# Patient Record
Sex: Male | Born: 1945
Health system: Southern US, Community
[De-identification: ages and names within clinical notes are randomized; demographics above are authoritative.]

## PROBLEM LIST (undated history)

## (undated) DIAGNOSIS — K5790 Diverticulosis of intestine, part unspecified, without perforation or abscess without bleeding: Secondary | ICD-10-CM

## (undated) DIAGNOSIS — I10 Essential (primary) hypertension: Secondary | ICD-10-CM

## (undated) DIAGNOSIS — E785 Hyperlipidemia, unspecified: Secondary | ICD-10-CM

## (undated) DIAGNOSIS — M1990 Unspecified osteoarthritis, unspecified site: Secondary | ICD-10-CM

## (undated) HISTORY — PX: KNEE ARTHROSCOPY: SHX127

## (undated) HISTORY — PX: KNEE ARTHROSCOPY: SUR90

---

## 2004-11-28 ENCOUNTER — Ambulatory Visit: Payer: Self-pay | Admitting: Gastroenterology

## 2004-12-10 ENCOUNTER — Ambulatory Visit: Payer: Self-pay | Admitting: Gastroenterology

## 2005-07-29 ENCOUNTER — Ambulatory Visit (HOSPITAL_COMMUNITY): Admission: RE | Admit: 2005-07-29 | Discharge: 2005-07-29 | Payer: Self-pay | Admitting: Orthopedic Surgery

## 2008-05-23 ENCOUNTER — Ambulatory Visit: Payer: Self-pay | Admitting: Gastroenterology

## 2008-05-30 ENCOUNTER — Ambulatory Visit: Payer: Self-pay | Admitting: Gastroenterology

## 2008-05-30 LAB — CONVERTED CEMR LAB
ALT: 52 units/L (ref 0–53)
AST: 32 units/L (ref 0–37)
Albumin: 4.4 g/dL (ref 3.5–5.2)
Basophils Absolute: 0.1 10*3/uL (ref 0.0–0.1)
Basophils Relative: 1.6 % (ref 0.0–3.0)
CO2: 30 meq/L (ref 19–32)
Calcium: 9 mg/dL (ref 8.4–10.5)
Chloride: 105 meq/L (ref 96–112)
Eosinophils Absolute: 0 10*3/uL (ref 0.0–0.7)
Ferritin: 411.5 ng/mL — ABNORMAL HIGH (ref 22.0–322.0)
Folate: 5.6 ng/mL
GFR calc Af Amer: 98 mL/min
HDL: 37.4 mg/dL — ABNORMAL LOW (ref >39.0)
Lymphocytes Relative: 24.3 % (ref 12.0–46.0)
MCHC: 35.1 g/dL (ref 30.0–36.0)
MCV: 101.5 fL — ABNORMAL HIGH (ref 78.0–100.0)
Neutrophils Relative %: 68.8 % (ref 43.0–77.0)
PSA: 0.28 ng/mL (ref 0.10–4.00)
Platelets: 146 10*3/uL — ABNORMAL LOW (ref 150–400)
Potassium: 4.3 meq/L (ref 3.5–5.1)
RBC: 4.28 M/uL (ref 4.22–5.81)
Total Protein: 6.7 g/dL (ref 6.0–8.3)
Triglycerides: 85 mg/dL (ref 0–149)
VLDL: 17 mg/dL (ref 0–40)
Vitamin B-12: 480 pg/mL (ref 211–911)
WBC: 5.8 10*3/uL (ref 4.5–10.5)

## 2009-07-25 ENCOUNTER — Ambulatory Visit (HOSPITAL_BASED_OUTPATIENT_CLINIC_OR_DEPARTMENT_OTHER): Admission: RE | Admit: 2009-07-25 | Discharge: 2009-07-25 | Payer: Self-pay | Admitting: Orthopedic Surgery

## 2010-11-13 LAB — FUNGUS CULTURE W SMEAR

## 2010-11-13 LAB — ANAEROBIC CULTURE

## 2010-11-13 LAB — WOUND CULTURE: Gram Stain: NONE SEEN

## 2010-12-28 NOTE — Op Note (Signed)
NAME:  Christopher Gibbs, Christopher Gibbs NO.:  [id number]   MEDICAL RECORD NO.:  [id number]          PATIENT TYPE:  AMB   LOCATION:  DAY                          FACILITY:  [facility]   PHYSICIAN:  Ollen Gross, M.D.    DATE OF BIRTH:  05-09-1946   DATE OF PROCEDURE:  07/29/2005  DATE OF DISCHARGE:                                 OPERATIVE REPORT   PREOPERATIVE DIAGNOSIS:  Left knee medial meniscal tear and chondral defect.   PREOPERATIVE DIAGNOSIS:  Left knee medial meniscal tear and chondral defect.   PROCEDURE:  Left knee arthroscopy with meniscal debridement medially and  chondroplasty.   SURGEON:  Dr. Lequita Halt   ASSISTANT:  None.   ANESTHESIA:  General.   ESTIMATED BLOOD LOSS:  Minimal.   COMPLICATIONS:  None.   CONDITION:  Stable to recovery.   BRIEF CLINICAL NOTE:  Christopher Gibbs is a 65 year old male with a several month  history of progressively worsening left knee pain and mechanical symptoms.  Exam and history suggested meniscal tear confirmed by MRI.  He presents now  for arthroscopy and debridement.   PROCEDURE IN DETAIL:  After the successful administration of general  anesthetic, a tourniquet is placed on the left thigh and left lower  extremity prepped and draped in the usual sterile fashion.  The arthroscopic  portals are then infiltrated with a total of 20 mL of 1% Xylocaine.  Standard superomedial and inferolateral incisions are made, and the inflow  cannula is passed superomedial and camera passed inferolateral.  Arthroscopic visualization proceeds.   The undersurface of the patella showed some grade 1 and 2 chondromalacia but  no focal defects and no unstable-appearing cartilage.  The trochlea had a  similar appearance with grade 2 and some areas of grade 3 but no exposed  bone and no unstable cartilage.  Medial and lateral gutters are visualized.  There is significant synovitis but no loose bodies in the gutters.  Flexion  valgus force is applied to the  knee, and the medial compartment is entered.  There is immediate evidence of a tear in the body and posterior horn of the  medial meniscus as well as a large chondral delamination on the medial  femoral condyle.  The spinal needle is used to localize the inferomedial  portal, small incision made, dilator placed, and the meniscus is found to be  unstable.  The meniscus is debrided back to a stable base with baskets and a  4.2 mm shaver.  It is probed and found to be stable.  Medial femoral condyle  has a 2 x 2 cm area where the cartilage is delaminating.  This is debrided  back to a stable bony base with stable cartilaginous edges.  The surrounding  centimeters, though had some chondromalacia but no full-thickness defects.  Once the debridement is performed, the surface is abraded.  The tibial  plateau looked normal.  Intercondylar notch is visualized; ACL looks normal.  The lateral compartment also looks normal.  The ArthroCare device is then  used to seal the edges of the medial meniscus and also to debride  the beefy-  red hypertrophic synovium.  Once this is completed, then the  arthroscopic equipment is removed from the inferior portals which are closed  with interrupted 4-0 nylon.  Marcaine 20 mL 0.25% with epinephrine are  injected through the inflow cannula.  Then that is removed and that portal  closed with nylon.  A bulky sterile dressing is applied, and he is awakened  and transported to recovery in stable condition.      Ollen Gross, M.D.  Electronically Signed     FA/MEDQ  D:  07/29/2005  T:  07/30/2005  Job:  [id number]

## 2012-12-15 ENCOUNTER — Other Ambulatory Visit: Payer: Self-pay | Admitting: Dermatology

## 2013-03-12 ENCOUNTER — Encounter: Payer: Self-pay | Admitting: Gastroenterology

## 2013-05-18 ENCOUNTER — Other Ambulatory Visit: Payer: Self-pay | Admitting: Dermatology

## 2013-06-14 ENCOUNTER — Encounter: Payer: Self-pay | Admitting: Gastroenterology

## 2013-06-24 ENCOUNTER — Encounter: Payer: Self-pay | Admitting: Gastroenterology

## 2013-06-24 ENCOUNTER — Ambulatory Visit (AMBULATORY_SURGERY_CENTER): Payer: Self-pay

## 2013-06-24 VITALS — Ht 71.0 in | Wt 235.0 lb

## 2013-06-24 DIAGNOSIS — Z1211 Encounter for screening for malignant neoplasm of colon: Secondary | ICD-10-CM

## 2013-06-24 MED ORDER — MOVIPREP 100 G PO SOLR: 1.0000 | Freq: Once | ORAL | Status: DC

## 2013-07-05 ENCOUNTER — Encounter: Payer: Self-pay | Admitting: Gastroenterology

## 2013-07-05 ENCOUNTER — Other Ambulatory Visit: Payer: Self-pay | Admitting: *Deleted

## 2013-07-05 ENCOUNTER — Other Ambulatory Visit (INDEPENDENT_AMBULATORY_CARE_PROVIDER_SITE_OTHER): Payer: BC Managed Care – PPO

## 2013-07-05 ENCOUNTER — Ambulatory Visit (AMBULATORY_SURGERY_CENTER): Payer: BC Managed Care – PPO | Admitting: Gastroenterology

## 2013-07-05 VITALS — BP 116/79 | HR 57 | Temp 98.7°F | Resp 19 | Ht 71.0 in | Wt 235.0 lb

## 2013-07-05 DIAGNOSIS — R6889 Other general symptoms and signs: Secondary | ICD-10-CM

## 2013-07-05 DIAGNOSIS — Z1211 Encounter for screening for malignant neoplasm of colon: Secondary | ICD-10-CM

## 2013-07-05 DIAGNOSIS — R899 Unspecified abnormal finding in specimens from other organs, systems and tissues: Secondary | ICD-10-CM

## 2013-07-05 DIAGNOSIS — K5730 Diverticulosis of large intestine without perforation or abscess without bleeding: Secondary | ICD-10-CM

## 2013-07-05 LAB — CBC WITH DIFFERENTIAL/PLATELET
Basophils Absolute: 0 10*3/uL (ref 0.0–0.1)
Basophils Relative: 0.5 % (ref 0.0–3.0)
Eosinophils Absolute: 0 10*3/uL (ref 0.0–0.7)
Hemoglobin: 13.9 g/dL (ref 13.0–17.0)
MCHC: 34.9 g/dL (ref 30.0–36.0)
MCV: 98.6 fl (ref 78.0–100.0)
Monocytes Absolute: 0.3 10*3/uL (ref 0.1–1.0)
Monocytes Relative: 6 % (ref 3.0–12.0)
Neutro Abs: 3.6 10*3/uL (ref 1.4–7.7)
Platelets: 149 10*3/uL — ABNORMAL LOW (ref 150.0–400.0)
RBC: 4.03 Mil/uL — ABNORMAL LOW (ref 4.22–5.81)

## 2013-07-05 LAB — FERRITIN: Ferritin: 241.7 ng/mL (ref 22.0–322.0)

## 2013-07-05 LAB — LIPID PANEL
Cholesterol: 184 mg/dL (ref 0–200)
Total CHOL/HDL Ratio: 6

## 2013-07-05 LAB — COMPREHENSIVE METABOLIC PANEL
AST: 37 U/L (ref 0–37)
Alkaline Phosphatase: 53 U/L (ref 39–117)
BUN: 14 mg/dL (ref 6–23)
Calcium: 8.6 mg/dL (ref 8.4–10.5)
Creatinine, Ser: 1 mg/dL (ref 0.4–1.5)
Sodium: 138 mEq/L (ref 135–145)

## 2013-07-05 LAB — IBC PANEL
Iron: 82 ug/dL (ref 42–165)
Transferrin: 236.1 mg/dL (ref 212.0–360.0)

## 2013-07-05 LAB — VITAMIN B12: Vitamin B-12: 515 pg/mL (ref 211–911)

## 2013-07-05 LAB — FOLATE: Folate: 7 ng/mL (ref >5.9)

## 2013-07-05 MED ORDER — PRAMOXINE-HC 1-1 % EX CREA: TOPICAL_CREAM | Freq: Three times a day (TID) | CUTANEOUS | Status: DC

## 2013-07-05 MED ORDER — SODIUM CHLORIDE 0.9 % IV SOLN: 500.0000 mL | INTRAVENOUS | Status: DC

## 2013-07-05 NOTE — Patient Instructions (Signed)

## 2013-07-05 NOTE — Progress Notes (Signed)
Patient did not experience any of the following events: a burn prior to discharge; a fall within the facility; wrong site/side/patient/procedure/implant event; or a hospital transfer or hospital admission upon discharge from the facility. (G8907) Patient did not have preoperative order for IV antibiotic SSI prophylaxis. (G8918)  

## 2013-07-05 NOTE — Op Note (Signed)
[facility] [address] [place], [address]   COLONOSCOPY PROCEDURE REPORT  PATIENT: Christopher, Gibbs  MR#: [id number] BIRTHDATE: 1945-10-31 , 66  yrs. old GENDER: Male ENDOSCOPIST: Mardella Layman, MD, [facility] REFERRED BY: PROCEDURE DATE:  07/05/2013 PROCEDURE:   Colonoscopy, screening First Screening Colonoscopy - Avg.  risk and is 50 yrs.  old or older - No.      History of Adenoma - Now for follow-up colonoscopy & has been > or = to 3 yrs.  N/A ASA CLASS:   Class I INDICATIONS:average risk screening. MEDICATIONS: propofol (Diprivan) 350mg  IV  DESCRIPTION OF PROCEDURE:   After the risks benefits and alternatives of the procedure were thoroughly explained, informed consent was obtained.  A digital rectal exam revealed no abnormalities of the rectum.   The LB [id number]  endoscope was introduced through the anus and advanced to the cecum, which was identified by both the appendix and ileocecal valve. No adverse events experienced.   The quality of the prep was good, using MoviPrep  The instrument was then slowly withdrawn as the colon was fully examined.      COLON FINDINGS: There was severe diverticulosis noted throughout the entire examined colon, more pronounced in the descending colon, and sigmoid colon with associated angulation, muscular hypertrophy, colonic spasm and petechiae. The colon was otherwise normal.  There was no  inflammation, polyps or cancers unless previously stated.  Retroflexed views revealed no abnormalities but small hemorrhoids. The time to cecum=6 minutes 57 seconds.  Withdrawal time=9 minutes 15 seconds.  The scope was withdrawn and the procedure completed. COMPLICATIONS: There were no complications.  ENDOSCOPIC IMPRESSION: 1.   There was severe diverticulosis noted throughout the entire examined colon,  most advancedin the descending colon, and sigmoid colon with pseuodocolitis picture,no erosions or bleeding. 2.    The colon was otherwise normal ...no polyps noted.  RECOMMENDATIONS: 1.  High fiber diet 2.  Metamucil or benefiber 3.  Repeat Colonoscopy in 5 years...hard exam per severe diverticulosis.  eSigned:  Mardella Layman, MD, [facility] 07/05/2013 11:50 AM   cc:   PATIENT NAME:  Christopher, Gibbs MR#: [id number]

## 2013-07-05 NOTE — Progress Notes (Signed)
Report to pacu rn, vss, bbs=clear 

## 2013-07-06 ENCOUNTER — Telehealth: Payer: Self-pay

## 2013-07-06 NOTE — Telephone Encounter (Signed)
Left message on answering machine. 

## 2013-07-07 ENCOUNTER — Telehealth: Payer: Self-pay | Admitting: *Deleted

## 2013-07-07 DIAGNOSIS — R945 Abnormal results of liver function studies: Secondary | ICD-10-CM

## 2013-07-07 NOTE — Telephone Encounter (Signed)
lmom for pt to call back. Labs are entered in the system.

## 2013-07-07 NOTE — Telephone Encounter (Signed)
Message copied by Florene Glen on Wed Jul 07, 2013  1:33 PM ------      Message from: PATTERSON, DAVID R      Created: Tue Jul 06, 2013  8:45 AM       Previous upper abdominal ultrasound exam per slightly increased liver function tests.  Also need hepatitis C anybody, hepatitis B surface antigen, ceruloplasmin, CDT and ANA.  After all this is done he needs appointment to see me in the office.  We will discuss his lipid levels at that time. ------

## 2013-07-13 NOTE — Telephone Encounter (Signed)
Informed pt of the need for labs and a f/u appt.. Pt states he is very busy at work and does this need to be done before the holidays? Explained that the labs at least need to be done. The results will dictate how soon he needs to be seen. Pt states he is out of town and will be back tomorrow. He states he will try to have his labs done on Thursday.

## 2013-07-15 ENCOUNTER — Telehealth: Payer: Self-pay | Admitting: Gastroenterology

## 2013-07-16 NOTE — Telephone Encounter (Signed)
Pt stated he is going out of town and will gone now thru next week and he would rather come in January for labs and an OV. I asked if he could at least come for labs and he stated he doesn't have time now. Reminder in for 08/26/13.

## 2014-11-28 DIAGNOSIS — L603 Nail dystrophy: Secondary | ICD-10-CM | POA: Diagnosis not present

## 2014-11-28 DIAGNOSIS — L570 Actinic keratosis: Secondary | ICD-10-CM | POA: Diagnosis not present

## 2014-11-28 DIAGNOSIS — D1801 Hemangioma of skin and subcutaneous tissue: Secondary | ICD-10-CM | POA: Diagnosis not present

## 2014-11-28 DIAGNOSIS — L812 Freckles: Secondary | ICD-10-CM | POA: Diagnosis not present

## 2014-11-28 DIAGNOSIS — Z85828 Personal history of other malignant neoplasm of skin: Secondary | ICD-10-CM | POA: Diagnosis not present

## 2014-11-28 DIAGNOSIS — L821 Other seborrheic keratosis: Secondary | ICD-10-CM | POA: Diagnosis not present

## 2015-05-23 DIAGNOSIS — Z23 Encounter for immunization: Secondary | ICD-10-CM | POA: Diagnosis not present

## 2015-08-13 HISTORY — PX: JOINT REPLACEMENT: SHX530

## 2015-10-26 ENCOUNTER — Ambulatory Visit
Admission: RE | Admit: 2015-10-26 | Discharge: 2015-10-26 | Disposition: A | Discharge status: Home / Self Care | Payer: Medicare Other | Source: Ambulatory Visit | Attending: Sports Medicine | Admitting: Sports Medicine

## 2015-10-26 ENCOUNTER — Ambulatory Visit (INDEPENDENT_AMBULATORY_CARE_PROVIDER_SITE_OTHER): Payer: Medicare Other | Admitting: Sports Medicine

## 2015-10-26 ENCOUNTER — Encounter: Payer: Self-pay | Admitting: Sports Medicine

## 2015-10-26 VITALS — BP 179/91 | HR 56 | Ht 71.0 in | Wt 250.0 lb

## 2015-10-26 DIAGNOSIS — M25551 Pain in right hip: Secondary | ICD-10-CM | POA: Diagnosis not present

## 2015-10-26 DIAGNOSIS — M1611 Unilateral primary osteoarthritis, right hip: Secondary | ICD-10-CM | POA: Diagnosis not present

## 2015-10-26 MED ORDER — MELOXICAM 15 MG PO TABS: ORAL_TABLET | ORAL | Status: DC

## 2015-10-26 NOTE — Progress Notes (Signed)
  Christopher Gibbs - 70 y.o. male MRN [id number]  Date of birth: Dec 24, 1945  SUBJECTIVE:  Including CC & ROS.  Christopher Gibbs is a 70 y.o. male who presents today for right hip pain.    Hip Pain right, initial visit 10/26/15 - patient presents today for right hip pain 2 weeks. Is located in the anterior aspect of his groin and does not radiate down his leg or into his perineal region. This began after a trip in [place] where he did a lot of hiking and ascending/descending hills. He denies swelling in the area and has been taking ibuprofen with minimal relief. Pain comes and goes and is worse at the end of the day or after sitting for prolonged periods. He has never had x-rays of his hip for or any previous imaging. He is walking with a limp occasionally.  PMHx - Updated and reviewed.  Contributory factors include: Negative PSHx - Updated and reviewed.  Contributory factors include:  Arthroscopy bilateral knee FHx - Updated and reviewed.  Contributory factors include: Negative Social Hx - Updated and reviewed. Contributory factors include: Nonsmoker Medications -updated and reviewed   12 point ROS negative other than per HPI.   Exam:  Filed Vitals:   10/26/15 1020  BP: 179/91  Pulse: 56    Gen: NAD, AAO 3 Cardio- RRR Pulm - Normal respiratory effort/rate Skin: No rashes or erythema Extremities: No edema  Vascular: pulses +2 bilateral upper and lower extremity Psych: Normal affect  R Hip Exam:  Pelvic alignment unremarkable to inspection and palpation. Standing hip rotation and gait without trendelenburg / unsteadiness. Greater trochanter without tenderness to palpation. No tenderness over piriformis and greater trochanter. No SI joint tenderness and normal minimal SI movement. ROM: IR: 50 Deg, ER: 40 Deg, Flexion: 120 Deg, Extension: 100 Deg, Abduction: 45 Deg, Adduction: 45 Deg Strength:  IR: 5/5, ER: 5/5, Flexion (0 and 90 degrees): 5/5, Extension: 5/5, Abduction: 5/5, Adduction:  5/5 Negative Crecencio test  +FABER anterior hip Gait - no antalgic gait Neurovascularly intact B/L LE  Imaging: 2 view pelvis 10/26/15

## 2015-10-26 NOTE — Assessment & Plan Note (Signed)
From his hip exam and story is most likely underlying degenerative joint disease/primary localized osteoarthritis of the right hip. He denies any radicular symptoms or red flags including bowel or bladder loss/unintentional weight loss or night sweats. -Two-view pelvis to evaluate his acetabular femoral joint. -Mobic daily and activity modification. -Follow-up after x-rays to discuss results as well as possible hip injection. If his hip x-rays do not show a substantial amount of degenerative joint disease, I would consider workup of his back/lumbar region

## 2015-10-31 ENCOUNTER — Other Ambulatory Visit: Payer: Self-pay | Admitting: *Deleted

## 2015-10-31 MED ORDER — TRAMADOL HCL 50 MG PO TABS: 50.0000 mg | ORAL_TABLET | Freq: Three times a day (TID) | ORAL | Status: DC

## 2015-11-01 ENCOUNTER — Ambulatory Visit: Payer: Self-pay | Admitting: Sports Medicine

## 2015-11-08 ENCOUNTER — Encounter: Payer: Self-pay | Admitting: Family Medicine

## 2015-11-08 ENCOUNTER — Ambulatory Visit (INDEPENDENT_AMBULATORY_CARE_PROVIDER_SITE_OTHER): Payer: Medicare Other | Admitting: Family Medicine

## 2015-11-08 VITALS — BP 164/76 | Ht 71.0 in | Wt 250.0 lb

## 2015-11-08 DIAGNOSIS — M25551 Pain in right hip: Secondary | ICD-10-CM

## 2015-11-08 MED ORDER — METHYLPREDNISOLONE ACETATE 40 MG/ML IJ SUSP
40.0000 mg | Freq: Once | INTRAMUSCULAR | Status: AC
  Administered 2015-11-08: 40 mg via INTRA_ARTICULAR

## 2015-11-08 NOTE — Assessment & Plan Note (Signed)
Continues to point towards groin with + C sign.  His x-rays show some spurring and joint space narrowing but not terrible - Dx/therapeutic injection of the R hip today to see how this does - He will f/u in 4 weeks to check on how he is doing.  If minimal improvement, would further image his back and start w/u this.  If substantial relief, continue injection PRN.     Aspiration/Injection Procedure Note Christopher Gibbs. 05-Aug-1946  Procedure: Injection Indications: R hip pain   Procedure Details Consent: Risks of procedure as well as the alternatives and risks of each were explained to the (patient/caregiver).  Consent for procedure obtained. Time Out: Verified patient identification, verified procedure, site/side was marked, verified correct patient position, special equipment/implants available, medications/allergies/relevent history reviewed, required imaging and test results available.  Performed.  The area was cleaned with iodine and alcohol swabs.    The R hip was injected using 2 cc's of 40mg  Depomedrol and 6 cc's of 1% lidocaine with a spinal needle.  Ultrasound was. Images were obtained in Transverse and Long views showing the injection.   A sterile dressing was applied.  Patient did tolerate procedure well. Estimated blood loss: None

## 2015-11-08 NOTE — Progress Notes (Signed)
  Christopher Gibbs. - 70 y.o. male MRN [id number]  Date of birth: 07-25-1946  SUBJECTIVE:  Including CC & ROS.  Christopher Gibbs. is a 70 y.o. male who presents today for right hip pain.    Hip Pain right, initial visit 10/26/15 - patient presents today for right hip pain 2 weeks. Is located in the anterior aspect of his groin and does not radiate down his leg or into his perineal region. This began after a trip in [place] where he did a lot of hiking and ascending/descending hills. He denies swelling in the area and has been taking ibuprofen with minimal relief. Pain comes and goes and is worse at the end of the day or after sitting for prolonged periods. He has never had x-rays of his hip for or any previous imaging. He is walking with a limp occasionally.  F/U visit 11/08/15 - Pt continues to have anterior groin pain, worse with trying to tie his shoes or bend down.  Hip x-rays from 3/17 show acetabular spurring and slight joint space narrowing.  No paresthesias down his leg.   PMHx - Updated and reviewed.  Contributory factors include: Negative PSHx - Updated and reviewed.  Contributory factors include:  Arthroscopy bilateral knee FHx - Updated and reviewed.  Contributory factors include: Negative Social Hx - Updated and reviewed. Contributory factors include: Nonsmoker Medications -updated and reviewed   12 point ROS negative other than per HPI.   Exam:  Filed Vitals:   11/08/15 1610  BP: 164/76    Gen: NAD, AAO 3 Cardio- RRR Pulm - Normal respiratory effort/rate Skin: No rashes or erythema Extremities: No edema  Vascular: pulses +2 bilateral upper and lower extremity Psych: Normal affect  R Hip Exam:  Pelvic alignment unremarkable to inspection and palpation. Standing hip rotation and gait without trendelenburg / unsteadiness. Greater trochanter without tenderness to palpation.. ROM: IR: 50 Deg, ER: 40 Deg, Flexion: 120 Deg, Extension: 100 Deg, Abduction: 45 Deg, Adduction:  45 Deg Strength:  IR: 5/5, ER: 5/5, Flexion (0 and 90 degrees): 5/5, Extension: 5/5, Abduction: 5/5, Adduction: 5/5 Negative Morse test  +FABER anterior hip Gait - no antalgic gait Neurovascularly intact B/L LE  Imaging: 2 view pelvis 10/26/15 - superior acetabular osteophyte with minimal joint space narrowing and subchondral sclerosis

## 2015-11-09 ENCOUNTER — Ambulatory Visit: Payer: Medicare Other | Admitting: Sports Medicine

## 2015-11-27 DIAGNOSIS — D485 Neoplasm of uncertain behavior of skin: Secondary | ICD-10-CM | POA: Diagnosis not present

## 2015-11-27 DIAGNOSIS — C44319 Basal cell carcinoma of skin of other parts of face: Secondary | ICD-10-CM | POA: Diagnosis not present

## 2015-11-27 DIAGNOSIS — Z85828 Personal history of other malignant neoplasm of skin: Secondary | ICD-10-CM | POA: Diagnosis not present

## 2015-11-27 DIAGNOSIS — L821 Other seborrheic keratosis: Secondary | ICD-10-CM | POA: Diagnosis not present

## 2015-11-27 DIAGNOSIS — C44311 Basal cell carcinoma of skin of nose: Secondary | ICD-10-CM | POA: Diagnosis not present

## 2015-11-27 DIAGNOSIS — D1801 Hemangioma of skin and subcutaneous tissue: Secondary | ICD-10-CM | POA: Diagnosis not present

## 2015-11-27 DIAGNOSIS — L812 Freckles: Secondary | ICD-10-CM | POA: Diagnosis not present

## 2015-12-07 DIAGNOSIS — M7061 Trochanteric bursitis, right hip: Secondary | ICD-10-CM | POA: Diagnosis not present

## 2015-12-07 DIAGNOSIS — M1611 Unilateral primary osteoarthritis, right hip: Secondary | ICD-10-CM | POA: Diagnosis not present

## 2015-12-13 ENCOUNTER — Ambulatory Visit: Payer: Medicare Other | Admitting: Family Medicine

## 2015-12-27 ENCOUNTER — Ambulatory Visit: Payer: Medicare Other | Admitting: Family Medicine

## 2016-01-01 DIAGNOSIS — C44311 Basal cell carcinoma of skin of nose: Secondary | ICD-10-CM | POA: Diagnosis not present

## 2016-01-01 DIAGNOSIS — Z85828 Personal history of other malignant neoplasm of skin: Secondary | ICD-10-CM | POA: Diagnosis not present

## 2016-01-11 DIAGNOSIS — M7061 Trochanteric bursitis, right hip: Secondary | ICD-10-CM | POA: Diagnosis not present

## 2016-01-11 DIAGNOSIS — M76891 Other specified enthesopathies of right lower limb, excluding foot: Secondary | ICD-10-CM | POA: Diagnosis not present

## 2016-01-22 DIAGNOSIS — M76891 Other specified enthesopathies of right lower limb, excluding foot: Secondary | ICD-10-CM | POA: Diagnosis not present

## 2016-01-29 DIAGNOSIS — M76891 Other specified enthesopathies of right lower limb, excluding foot: Secondary | ICD-10-CM | POA: Diagnosis not present

## 2016-02-01 DIAGNOSIS — M76891 Other specified enthesopathies of right lower limb, excluding foot: Secondary | ICD-10-CM | POA: Diagnosis not present

## 2016-02-05 DIAGNOSIS — M76891 Other specified enthesopathies of right lower limb, excluding foot: Secondary | ICD-10-CM | POA: Diagnosis not present

## 2016-02-08 DIAGNOSIS — M76891 Other specified enthesopathies of right lower limb, excluding foot: Secondary | ICD-10-CM | POA: Diagnosis not present

## 2016-02-19 DIAGNOSIS — M76891 Other specified enthesopathies of right lower limb, excluding foot: Secondary | ICD-10-CM | POA: Diagnosis not present

## 2016-02-22 DIAGNOSIS — M7061 Trochanteric bursitis, right hip: Secondary | ICD-10-CM | POA: Diagnosis not present

## 2016-02-22 DIAGNOSIS — M76891 Other specified enthesopathies of right lower limb, excluding foot: Secondary | ICD-10-CM | POA: Diagnosis not present

## 2016-03-04 DIAGNOSIS — M7061 Trochanteric bursitis, right hip: Secondary | ICD-10-CM | POA: Diagnosis not present

## 2016-03-12 DIAGNOSIS — M76891 Other specified enthesopathies of right lower limb, excluding foot: Secondary | ICD-10-CM | POA: Diagnosis not present

## 2016-03-12 DIAGNOSIS — M7061 Trochanteric bursitis, right hip: Secondary | ICD-10-CM | POA: Diagnosis not present

## 2016-03-12 DIAGNOSIS — M1611 Unilateral primary osteoarthritis, right hip: Secondary | ICD-10-CM | POA: Diagnosis not present

## 2016-03-19 DIAGNOSIS — M1611 Unilateral primary osteoarthritis, right hip: Secondary | ICD-10-CM | POA: Diagnosis not present

## 2016-05-16 DIAGNOSIS — M1611 Unilateral primary osteoarthritis, right hip: Secondary | ICD-10-CM | POA: Diagnosis not present

## 2016-05-16 DIAGNOSIS — M7061 Trochanteric bursitis, right hip: Secondary | ICD-10-CM | POA: Diagnosis not present

## 2016-05-23 ENCOUNTER — Ambulatory Visit: Payer: Self-pay | Admitting: Orthopedic Surgery

## 2016-05-28 ENCOUNTER — Other Ambulatory Visit: Payer: Self-pay | Admitting: Family Medicine

## 2016-05-28 ENCOUNTER — Ambulatory Visit
Admission: RE | Admit: 2016-05-28 | Discharge: 2016-05-28 | Disposition: A | Discharge status: Home / Self Care | Payer: Medicare Other | Source: Ambulatory Visit | Attending: Family Medicine | Admitting: Family Medicine

## 2016-05-28 DIAGNOSIS — Z23 Encounter for immunization: Secondary | ICD-10-CM | POA: Diagnosis not present

## 2016-05-28 DIAGNOSIS — Z01818 Encounter for other preprocedural examination: Secondary | ICD-10-CM

## 2016-05-28 DIAGNOSIS — R918 Other nonspecific abnormal finding of lung field: Secondary | ICD-10-CM | POA: Diagnosis not present

## 2016-05-28 DIAGNOSIS — R030 Elevated blood-pressure reading, without diagnosis of hypertension: Secondary | ICD-10-CM | POA: Diagnosis not present

## 2016-05-28 DIAGNOSIS — M25551 Pain in right hip: Secondary | ICD-10-CM | POA: Diagnosis not present

## 2016-05-28 DIAGNOSIS — M1611 Unilateral primary osteoarthritis, right hip: Secondary | ICD-10-CM | POA: Diagnosis not present

## 2016-06-03 NOTE — Patient Instructions (Addendum)
Christopher Gibbs.  06/03/2016   Your procedure is scheduled on: 06-10-16   Report to [facility] Main  Entrance take [facility]  elevators to 3rd floor to  [place] at   1230 PM.  Call this number if you have problems the morning of surgery [phone]   Remember: ONLY 1 PERSON MAY GO WITH YOU TO SHORT STAY TO GET  READY MORNING OF [place].  Do not eat food or drink liquids :After Midnight.Exception Clear Liquids 12 midnight to 0900 AM, then nothing.   CLEAR LIQUID DIET   Foods Allowed                                                                     Foods Excluded  Coffee and tea, regular and decaf                             liquids that you cannot  Plain Jell-O in any flavor                                             see through such as: Fruit ices (not with fruit pulp)                                     milk, soups, orange juice  Iced Popsicles                                    All solid food Carbonated beverages, regular and diet                                    Cranberry, grape and apple juices Sports drinks like Gatorade Lightly seasoned clear broth or consume(fat free) Sugar, honey syrup  _____________________________________________________________________       Take these medicines the morning of surgery with A SIP OF WATER: Amlodipine. DO NOT TAKE ANY DIABETIC MEDICATIONS DAY OF YOUR SURGERY                               You may not have any metal on your body including hair pins and              piercings  Do not wear jewelry, make-up, lotions, powders or perfumes, deodorant             Do not wear nail polish.  Do not shave  48 hours prior to surgery.              Men may shave face and neck.   Do not bring valuables to the hospital. [place]  VALUABLES.  Contacts, dentures or bridgework may not be worn into surgery.  Leave suitcase in the car. After surgery it may be brought to  your room.     Patients discharged the day of surgery will not be allowed to drive home.  Name and phone number of your driver: Christopher Gibbs spouse [phone] cell  Special Instructions: N/A              Please read over the following fact sheets you were given: _____________________________________________________________________             [facility] - Preparing for Surgery Before surgery, you can play an important role.  Because skin is not sterile, your skin needs to be as free of germs as possible.  You can reduce the number of germs on your skin by washing with CHG (chlorahexidine gluconate) soap before surgery.  CHG is an antiseptic cleaner which kills germs and bonds with the skin to continue killing germs even after washing. Please DO NOT use if you have an allergy to CHG or antibacterial soaps.  If your skin becomes reddened/irritated stop using the CHG and inform your nurse when you arrive at Short Stay. Do not shave (including legs and underarms) for at least 48 hours prior to the first CHG shower.  You may shave your face/neck. Please follow these instructions carefully:  1.  Shower with CHG Soap the night before surgery and the  morning of Surgery.  2.  If you choose to wash your hair, wash your hair first as usual with your  normal  shampoo.  3.  After you shampoo, rinse your hair and body thoroughly to remove the  shampoo.                           4.  Use CHG as you would any other liquid soap.  You can apply chg directly  to the skin and wash                       Gently with a scrungie or clean washcloth.  5.  Apply the CHG Soap to your body ONLY FROM THE NECK DOWN.   Do not use on face/ open                           Wound or open sores. Avoid contact with eyes, ears mouth and genitals (private parts).                       Wash face,  Genitals (private parts) with your normal soap.             6.  Wash thoroughly, paying special attention to the area where your surgery   will be performed.  7.  Thoroughly rinse your body with warm water from the neck down.  8.  DO NOT shower/wash with your normal soap after using and rinsing off  the CHG Soap.                9.  Pat yourself dry with a clean towel.            10.  Wear clean pajamas.            11.  Place clean sheets on your bed the night of your first shower and do not  sleep with pets. Day of Surgery :  Do not apply any lotions/deodorants the morning of surgery.  Please wear clean clothes to the hospital/surgery center.  FAILURE TO FOLLOW THESE INSTRUCTIONS MAY RESULT IN THE CANCELLATION OF YOUR SURGERY PATIENT SIGNATURE_________________________________  NURSE SIGNATURE__________________________________  ________________________________________________________________________   Christopher Gibbs  An incentive spirometer is a tool that can help keep your lungs clear and active. This tool measures how well you are filling your lungs with each breath. Taking long deep breaths may help reverse or decrease the chance of developing breathing (pulmonary) problems (especially infection) following:  A long period of time when you are unable to move or be active. BEFORE THE PROCEDURE   If the spirometer includes an indicator to show your best effort, your nurse or respiratory therapist will set it to a desired goal.  If possible, sit up straight or lean slightly forward. Try not to slouch.  Hold the incentive spirometer in an upright position. INSTRUCTIONS FOR USE  1. Sit on the edge of your bed if possible, or sit up as far as you can in bed or on a chair. 2. Hold the incentive spirometer in an upright position. 3. Breathe out normally. 4. Place the mouthpiece in your mouth and seal your lips tightly around it. 5. Breathe in slowly and as deeply as possible, raising the piston or the ball toward the top of the column. 6. Hold your breath for 3-5 seconds or for as long as possible. Allow the piston or  ball to fall to the bottom of the column. 7. Remove the mouthpiece from your mouth and breathe out normally. 8. Rest for a few seconds and repeat Steps 1 through 7 at least 10 times every 1-2 hours when you are awake. Take your time and take a few normal breaths between deep breaths. 9. The spirometer may include an indicator to show your best effort. Use the indicator as a goal to work toward during each repetition. 10. After each set of 10 deep breaths, practice coughing to be sure your lungs are clear. If you have an incision (the cut made at the time of surgery), support your incision when coughing by placing a pillow or rolled up towels firmly against it. Once you are able to get out of bed, walk around indoors and cough well. You may stop using the incentive spirometer when instructed by your caregiver.  RISKS AND COMPLICATIONS  Take your time so you do not get dizzy or light-headed.  If you are in pain, you may need to take or ask for pain medication before doing incentive spirometry. It is harder to take a deep breath if you are having pain. AFTER USE  Rest and breathe slowly and easily.  It can be helpful to keep track of a log of your progress. Your caregiver can provide you with a simple table to help with this. If you are using the spirometer at home, follow these instructions: [place] IF:   You are having difficultly using the spirometer.  You have trouble using the spirometer as often as instructed.  Your pain medication is not giving enough relief while using the spirometer.  You develop fever of 100.5 F (38.1 C) or higher. SEEK IMMEDIATE MEDICAL CARE IF:   You cough up bloody sputum that had not been present before.  You develop fever of 102 F (38.9 C) or greater.  You develop worsening pain at or near the incision site. MAKE SURE YOU:   Understand these instructions.  Will watch your condition.  Will get help right away if you are not doing well  or get worse. Document Released: 12/09/2006 Document Revised: 10/21/2011 Document Reviewed: 02/09/2007 ExitCare Patient Information 2014 ExitCare, [place].   ________________________________________________________________________  WHAT IS A BLOOD TRANSFUSION? Blood Transfusion Information  A transfusion is the replacement of blood or some of its parts. Blood is made up of multiple cells which provide different functions.  Red blood cells carry oxygen and are used for blood loss replacement.  Doro blood cells fight against infection.  Platelets control bleeding.  Plasma helps clot blood.  Other blood products are available for specialized needs, such as hemophilia or other clotting disorders. BEFORE THE TRANSFUSION  Who gives blood for transfusions?   Healthy volunteers who are fully evaluated to make sure their blood is safe. This is blood bank blood. Transfusion therapy is the safest it has ever been in the practice of medicine. Before blood is taken from a donor, a complete history is taken to make sure that person has no history of diseases nor engages in risky social behavior (examples are intravenous drug use or sexual activity with multiple partners). The donor's travel history is screened to minimize risk of transmitting infections, such as malaria. The donated blood is tested for signs of infectious diseases, such as HIV and hepatitis. The blood is then tested to be sure it is compatible with you in order to minimize the chance of a transfusion reaction. If you or a relative donates blood, this is often done in anticipation of surgery and is not appropriate for emergency situations. It takes many days to process the donated blood. RISKS AND COMPLICATIONS Although transfusion therapy is very safe and saves many lives, the main dangers of transfusion include:   Getting an infectious disease.  Developing a transfusion reaction. This is an allergic reaction to something in the blood  you were given. Every precaution is taken to prevent this. The decision to have a blood transfusion has been considered carefully by your caregiver before blood is given. Blood is not given unless the benefits outweigh the risks. AFTER THE TRANSFUSION  Right after receiving a blood transfusion, you will usually feel much better and more energetic. This is especially true if your red blood cells have gotten low (anemic). The transfusion raises the level of the red blood cells which carry oxygen, and this usually causes an energy increase.  The nurse administering the transfusion will monitor you carefully for complications. HOME CARE INSTRUCTIONS  No special instructions are needed after a transfusion. You may find your energy is better. Speak with your caregiver about any limitations on activity for underlying diseases you may have. SEEK MEDICAL CARE IF:   Your condition is not improving after your transfusion.  You develop redness or irritation at the intravenous (IV) site. SEEK IMMEDIATE MEDICAL CARE IF:  Any of the following symptoms occur over the next 12 hours:  Shaking chills.  You have a temperature by mouth above 102 F (38.9 C), not controlled by medicine.  Chest, back, or muscle pain.  People around you feel you are not acting correctly or are confused.  Shortness of breath or difficulty breathing.  Dizziness and fainting.  You get a rash or develop hives.  You have a decrease in urine output.  Your urine turns a dark color or changes to pink, red, or brown. Any of the following symptoms occur over the next 10 days:  You have a temperature by mouth above 102 F (38.9 C), not controlled  by medicine.  Shortness of breath.  Weakness after normal activity.  The [place] part of the eye turns yellow (jaundice).  You have a decrease in the amount of urine or are urinating less often.  Your urine turns a dark color or changes to pink, red, or brown. Document Released:  07/26/2000 Document Revised: 10/21/2011 Document Reviewed: 03/14/2008 [facility] Patient Information 2014 [place], [place].  _______________________________________________________________________

## 2016-06-04 ENCOUNTER — Encounter (HOSPITAL_COMMUNITY): Payer: Self-pay

## 2016-06-04 ENCOUNTER — Encounter (HOSPITAL_COMMUNITY)
Admission: RE | Admit: 2016-06-04 | Discharge: 2016-06-04 | Disposition: A | Discharge status: Home / Self Care | Payer: Medicare Other | Source: Ambulatory Visit | Attending: Orthopedic Surgery | Admitting: Orthopedic Surgery

## 2016-06-04 DIAGNOSIS — Z01812 Encounter for preprocedural laboratory examination: Secondary | ICD-10-CM | POA: Diagnosis not present

## 2016-06-04 DIAGNOSIS — M1611 Unilateral primary osteoarthritis, right hip: Secondary | ICD-10-CM | POA: Insufficient documentation

## 2016-06-04 HISTORY — DX: Essential (primary) hypertension: I10

## 2016-06-04 HISTORY — DX: Unspecified osteoarthritis, unspecified site: M19.90

## 2016-06-04 LAB — URINALYSIS, ROUTINE W REFLEX MICROSCOPIC
Bilirubin Urine: NEGATIVE
GLUCOSE, UA: NEGATIVE mg/dL
HGB URINE DIPSTICK: NEGATIVE
Ketones, ur: NEGATIVE mg/dL
LEUKOCYTES UA: NEGATIVE
Nitrite: NEGATIVE
PH: 6.5 (ref 5.0–8.0)
Protein, ur: 30 mg/dL — AB
Specific Gravity, Urine: 1.019 (ref 1.005–1.030)

## 2016-06-04 LAB — SURGICAL PCR SCREEN
MRSA, PCR: NEGATIVE
STAPHYLOCOCCUS AUREUS: NEGATIVE

## 2016-06-04 LAB — URINE MICROSCOPIC-ADD ON

## 2016-06-04 LAB — ABO/RH: ABO/RH(D): O NEG

## 2016-06-04 LAB — PROTIME-INR
INR: 0.97
PROTHROMBIN TIME: 12.9 s (ref 11.4–15.2)

## 2016-06-04 LAB — APTT: aPTT: 34 seconds (ref 24–36)

## 2016-06-04 NOTE — Pre-Procedure Instructions (Signed)
CBC/d,CMP. LP -labs reports with chart 05-28-16. Requested recent 12 lead EKG and Cxr from Dr. Dorthy Cooler office, awaiting faxed reports.

## 2016-06-06 NOTE — Progress Notes (Signed)
06-06-16 1600  EKG 05-28-16, CXR 05-28-16 reports received and placed with chart.

## 2016-06-09 ENCOUNTER — Ambulatory Visit: Payer: Self-pay | Admitting: Orthopedic Surgery

## 2016-06-09 NOTE — H&P (Signed)
Christopher Gibbs DOB: [birth year] Married / Language: English / Race: [place] Male Date of Admission:  06/10/2016 CC:  Right Hip Pain History of Present Illness The patient is a 70 year old male who comes in for a preoperative History and Physical. The patient is scheduled for a right total hip arthroplasty (anterior) to be performed by Dr. Dione Plover. [name], MD at [facility] on 06-10-2016. The patient is a 70 year old male who presents today for follow up of their hip. The patient is being followed for their right hip pain. Symptoms reported include: pain and pain with weightbearing. The patient feels that they are doing poorly and report their pain level to be severe. The patient presented following IA hip injection. He states that the IA injection did help for about 3 weeks but now his hip is hurting again. He was prescribed meloxicam but did not take it long because he did not feel that it was helping. Christopher Gibbs says the hip is getting much worse at this time. He went away on vacation recently and could barely walk. Pain is in the groin radiating down the anterior thigh. He is losing more mobility. We reviewed the MRI scan. He has got a fairly impressive stress reaction femoral head and neck with the area of possible tiny collapse. He also has a moderate-sized effusion. There are degenerative changes superolaterally. He is ready to go ahead and get this fixed. They have been treated conservatively in the past for the above stated problem and despite conservative measures, they continue to have progressive pain and severe functional limitations and dysfunction. They have failed non-operative management including home exercise, medications, and injections. It is felt that they would benefit from undergoing total joint replacement. Risks and benefits of the procedure have been discussed with the patient and they elect to proceed with surgery. There are no active contraindications to surgery such as  ongoing infection or rapidly progressive neurological disease.  Problem List/Past Medical Hamstring tendinitis of right thigh [id number])  Primary osteoarthritis of right hip (M16.11)  Trochanteric bursitis of right hip (M70.61)  Bronchitis  Pneumonia  Hypertension  Hemorrhoids  Measles  Allergies No Known Drug Allergies  Family History Cancer  Father. First Degree Relatives  reported Heart Disease  Mother. Heart disease in male family member before age 62   Social History Children  2 Current drinker  12/07/2015: Currently drinks beer 5-7 times per week Current work status  working full time Exercise  Exercises weekly; does running / walking and gym / [employer] Living situation  live with spouse Marital status  married No history of drug/alcohol rehab  Not under pain contract  Number of flights of stairs before winded  4-5 Tobacco use  Never smoker. 12/07/2015 Advance Directives  Living Will [place] with Wife following the Right Total Hip to be performed on 06/10/2016 by Dr. Wynelle Link.  Medication History AmLODIPine Besylate (2.5MG  Tablet, Oral) Active.  Past Surgical History Arthroscopy of Knee  bilateral Left Knee Surgery  Right Knee Surgery    Review of Systems General Not Present- Chills, Fatigue, Fever, Memory Loss, Night Sweats, Weight Gain and Weight Loss. Skin Not Present- Eczema, Hives, Itching, Lesions and Rash. HEENT Not Present- Dentures, Double Vision, Headache, Hearing Loss, Tinnitus and Visual Loss. Respiratory Not Present- Allergies, Chronic Cough, Coughing up blood, Shortness of breath at rest and Shortness of breath with exertion. Cardiovascular Not Present- Chest Pain, Difficulty Breathing Lying Down, Murmur, Palpitations, Racing/skipping heartbeats and Swelling. Gastrointestinal  Not Present- Abdominal Pain, Bloody Stool, Constipation, Diarrhea, Difficulty Swallowing, Heartburn, Jaundice, Loss of appetitie,  Nausea and Vomiting. Male Genitourinary Not Present- Blood in Urine, Discharge, Flank Pain, Incontinence, Painful Urination, Urgency, Urinary frequency, Urinary Retention, Urinating at Night and Weak urinary stream. Musculoskeletal Present- Joint Pain. Not Present- Back Pain, Joint Swelling, Morning Stiffness, Muscle Pain, Muscle Weakness and Spasms. Neurological Not Present- Blackout spells, Difficulty with balance, Dizziness, Paralysis, Tremor and Weakness. Psychiatric Not Present- Insomnia.  Vitals  Weight: 245 lb Height: 71in Weight was reported by patient. Height was reported by patient. Body Surface Area: 2.3 m Body Mass Index: 34.17 kg/m  Pulse: 68 (Regular)  BP: 168/92 (Sitting, Left Arm, Standard)  Physical Exam  General Mental Status -Alert, cooperative and good historian. General Appearance-pleasant, Not in acute distress. Orientation-Oriented X3. Build & Nutrition-Well nourished and Well developed.  Head and Neck Head-normocephalic, atraumatic . Neck Global Assessment - supple, no bruit auscultated on the right, no bruit auscultated on the left.  Eye Pupil - Bilateral-Regular and Round. Motion - Bilateral-EOMI.  Chest and Lung Exam Auscultation Breath sounds - clear at anterior chest wall and clear at posterior chest wall. Adventitious sounds - No Adventitious sounds.  Cardiovascular Auscultation Rhythm - Regular rate and rhythm. Heart Sounds - S1 WNL and S2 WNL. Murmurs & Other Heart Sounds - Auscultation of the heart reveals - No Murmurs.  Abdomen Palpation/Percussion Tenderness - Abdomen is non-tender to palpation. Rigidity (guarding) - Abdomen is soft. Auscultation Auscultation of the abdomen reveals - Bowel sounds normal.  Male Genitourinary Note: Not done, not pertinent to present illness   Musculoskeletal Note: A well developed male in no distress. His right hip can be flexed to 100, no internal rotation, about 10 of  external rotation, 10 of abduction. Left hip has normal range of motion. He has a significantly antalgic gait pattern on the right. There is no tenderness about the hip.  IMAGING AP pelvis and lateral of the right hip show his femoral head is now collapsed. He has a large area, probably about 40 to 50% of the femoral head with collapse at this time.  Assessment & Plan Primary osteoarthritis of right hip (M16.11)  Note:Surgical Plans: Right Total Hip Replacement - Anterior Approach  Disposition: Homewith Wife  PCP: Dr. Scarlette Shorts  IV TXA  Anesthesia Issues: None  Signed electronically by Ok Edwards, III PA-C

## 2016-06-10 ENCOUNTER — Inpatient Hospital Stay (HOSPITAL_COMMUNITY): Payer: Medicare Other

## 2016-06-10 ENCOUNTER — Encounter (HOSPITAL_COMMUNITY)
Admission: RE | Disposition: A | Discharge status: Home Health | Payer: Self-pay | Source: Ambulatory Visit | Attending: Orthopedic Surgery

## 2016-06-10 ENCOUNTER — Encounter (HOSPITAL_COMMUNITY): Payer: Self-pay | Admitting: *Deleted

## 2016-06-10 ENCOUNTER — Inpatient Hospital Stay (HOSPITAL_COMMUNITY): Payer: Medicare Other | Admitting: Anesthesiology

## 2016-06-10 ENCOUNTER — Inpatient Hospital Stay (HOSPITAL_COMMUNITY)
Admission: RE | Admit: 2016-06-10 | Discharge: 2016-06-11 | DRG: 470 | Disposition: A | Discharge status: Home Health | Payer: Medicare Other | Source: Ambulatory Visit | Attending: Orthopedic Surgery | Admitting: Orthopedic Surgery

## 2016-06-10 DIAGNOSIS — M7061 Trochanteric bursitis, right hip: Secondary | ICD-10-CM | POA: Diagnosis present

## 2016-06-10 DIAGNOSIS — Z96641 Presence of right artificial hip joint: Secondary | ICD-10-CM | POA: Diagnosis not present

## 2016-06-10 DIAGNOSIS — M1611 Unilateral primary osteoarthritis, right hip: Secondary | ICD-10-CM | POA: Diagnosis not present

## 2016-06-10 DIAGNOSIS — Z471 Aftercare following joint replacement surgery: Secondary | ICD-10-CM | POA: Diagnosis not present

## 2016-06-10 DIAGNOSIS — M25551 Pain in right hip: Secondary | ICD-10-CM | POA: Diagnosis not present

## 2016-06-10 DIAGNOSIS — M76891 Other specified enthesopathies of right lower limb, excluding foot: Secondary | ICD-10-CM | POA: Diagnosis present

## 2016-06-10 DIAGNOSIS — I10 Essential (primary) hypertension: Secondary | ICD-10-CM | POA: Diagnosis present

## 2016-06-10 DIAGNOSIS — Z96649 Presence of unspecified artificial hip joint: Secondary | ICD-10-CM

## 2016-06-10 DIAGNOSIS — M169 Osteoarthritis of hip, unspecified: Secondary | ICD-10-CM | POA: Diagnosis present

## 2016-06-10 HISTORY — PX: TOTAL HIP ARTHROPLASTY: SHX124

## 2016-06-10 LAB — TYPE AND SCREEN
ABO/RH(D): O NEG
Antibody Screen: NEGATIVE

## 2016-06-10 SURGERY — ARTHROPLASTY, HIP, TOTAL, ANTERIOR APPROACH
Anesthesia: Monitor Anesthesia Care | Site: Hip | Laterality: Right

## 2016-06-10 MED ORDER — MIDAZOLAM HCL 2 MG/2ML IJ SOLN
INTRAMUSCULAR | Status: AC
  Filled 2016-06-10: qty 2

## 2016-06-10 MED ORDER — CEFAZOLIN SODIUM-DEXTROSE 2-4 GM/100ML-% IV SOLN
2.0000 g | INTRAVENOUS | Status: AC
  Administered 2016-06-10: 2 g via INTRAVENOUS
  Filled 2016-06-10: qty 100

## 2016-06-10 MED ORDER — DOCUSATE SODIUM 100 MG PO CAPS
100.0000 mg | ORAL_CAPSULE | Freq: Two times a day (BID) | ORAL | Status: DC
  Administered 2016-06-10 – 2016-06-11 (×2): 100 mg via ORAL
  Filled 2016-06-10 (×2): qty 1

## 2016-06-10 MED ORDER — POLYETHYLENE GLYCOL 3350 17 G PO PACK: 17.0000 g | PACK | Freq: Every day | ORAL | Status: DC | PRN

## 2016-06-10 MED ORDER — SODIUM CHLORIDE 0.9% FLUSH
15.0000 mL | Freq: Once | INTRAVENOUS | Status: AC
  Administered 2016-06-10: 15 mL

## 2016-06-10 MED ORDER — PROPOFOL 10 MG/ML IV BOLUS
INTRAVENOUS | Status: AC
  Filled 2016-06-10: qty 20

## 2016-06-10 MED ORDER — CEFAZOLIN SODIUM-DEXTROSE 2-4 GM/100ML-% IV SOLN
INTRAVENOUS | Status: AC
  Filled 2016-06-10: qty 100

## 2016-06-10 MED ORDER — DIPHENHYDRAMINE HCL 12.5 MG/5ML PO ELIX: 12.5000 mg | ORAL_SOLUTION | ORAL | Status: DC | PRN

## 2016-06-10 MED ORDER — SODIUM CHLORIDE 0.9 % IV SOLN
INTRAVENOUS | Status: DC
  Administered 2016-06-10 (×2): via INTRAVENOUS

## 2016-06-10 MED ORDER — MENTHOL 3 MG MT LOZG: 1.0000 | LOZENGE | OROMUCOSAL | Status: DC | PRN

## 2016-06-10 MED ORDER — PROPOFOL 500 MG/50ML IV EMUL
INTRAVENOUS | Status: DC | PRN
  Administered 2016-06-10: 75 ug/kg/min via INTRAVENOUS

## 2016-06-10 MED ORDER — METOCLOPRAMIDE HCL 5 MG/ML IJ SOLN: 5.0000 mg | Freq: Three times a day (TID) | INTRAMUSCULAR | Status: DC | PRN

## 2016-06-10 MED ORDER — AMLODIPINE BESYLATE 2.5 MG PO TABS
2.5000 mg | ORAL_TABLET | Freq: Every day | ORAL | Status: DC
  Administered 2016-06-11: 2.5 mg via ORAL
  Filled 2016-06-10: qty 1

## 2016-06-10 MED ORDER — METOCLOPRAMIDE HCL 5 MG PO TABS: 5.0000 mg | ORAL_TABLET | Freq: Three times a day (TID) | ORAL | Status: DC | PRN

## 2016-06-10 MED ORDER — DEXAMETHASONE SODIUM PHOSPHATE 10 MG/ML IJ SOLN
10.0000 mg | Freq: Once | INTRAMUSCULAR | Status: AC
  Administered 2016-06-10: 10 mg via INTRAVENOUS

## 2016-06-10 MED ORDER — BUPIVACAINE IN DEXTROSE 0.75-8.25 % IT SOLN
INTRATHECAL | Status: DC | PRN
  Administered 2016-06-10: 2 mL via INTRATHECAL

## 2016-06-10 MED ORDER — FENTANYL CITRATE (PF) 100 MCG/2ML IJ SOLN
INTRAMUSCULAR | Status: DC | PRN
  Administered 2016-06-10: 50 ug via INTRAVENOUS

## 2016-06-10 MED ORDER — ACETAMINOPHEN 650 MG RE SUPP: 650.0000 mg | Freq: Four times a day (QID) | RECTAL | Status: DC | PRN

## 2016-06-10 MED ORDER — TRANEXAMIC ACID 1000 MG/10ML IV SOLN
1000.0000 mg | Freq: Once | INTRAVENOUS | Status: AC
  Administered 2016-06-10: 1000 mg via INTRAVENOUS
  Filled 2016-06-10: qty 1100

## 2016-06-10 MED ORDER — DEXAMETHASONE SODIUM PHOSPHATE 10 MG/ML IJ SOLN
10.0000 mg | Freq: Once | INTRAMUSCULAR | Status: AC
  Administered 2016-06-11: 10 mg via INTRAVENOUS
  Filled 2016-06-10: qty 1

## 2016-06-10 MED ORDER — PNEUMOCOCCAL VAC POLYVALENT 25 MCG/0.5ML IJ INJ
0.5000 mL | INJECTION | INTRAMUSCULAR | Status: DC
  Filled 2016-06-10: qty 0.5

## 2016-06-10 MED ORDER — ONDANSETRON HCL 4 MG PO TABS: 4.0000 mg | ORAL_TABLET | Freq: Four times a day (QID) | ORAL | Status: DC | PRN

## 2016-06-10 MED ORDER — ACETAMINOPHEN 325 MG PO TABS: 650.0000 mg | ORAL_TABLET | Freq: Four times a day (QID) | ORAL | Status: DC | PRN

## 2016-06-10 MED ORDER — CHLORHEXIDINE GLUCONATE 4 % EX LIQD: 60.0000 mL | Freq: Once | CUTANEOUS | Status: DC

## 2016-06-10 MED ORDER — OXYCODONE HCL 5 MG PO TABS
5.0000 mg | ORAL_TABLET | ORAL | Status: DC | PRN
  Administered 2016-06-10 – 2016-06-11 (×5): 10 mg via ORAL
  Filled 2016-06-10 (×5): qty 2

## 2016-06-10 MED ORDER — PHENYLEPHRINE HCL 10 MG/ML IJ SOLN
INTRAVENOUS | Status: DC | PRN
  Administered 2016-06-10: 40 ug/min via INTRAVENOUS

## 2016-06-10 MED ORDER — ACETAMINOPHEN 500 MG PO TABS
1000.0000 mg | ORAL_TABLET | Freq: Four times a day (QID) | ORAL | Status: DC
  Administered 2016-06-10 – 2016-06-11 (×3): 1000 mg via ORAL
  Filled 2016-06-10 (×3): qty 2

## 2016-06-10 MED ORDER — PROPOFOL 10 MG/ML IV BOLUS
INTRAVENOUS | Status: AC
  Filled 2016-06-10: qty 40

## 2016-06-10 MED ORDER — BUPIVACAINE HCL 0.25 % IJ SOLN
INTRAMUSCULAR | Status: AC
  Filled 2016-06-10: qty 1

## 2016-06-10 MED ORDER — PHENOL 1.4 % MT LIQD
1.0000 | OROMUCOSAL | Status: DC | PRN
  Filled 2016-06-10: qty 177

## 2016-06-10 MED ORDER — MORPHINE SULFATE (PF) 2 MG/ML IV SOLN
1.0000 mg | INTRAVENOUS | Status: DC | PRN
Start: 2016-06-10 — End: 2016-06-11
  Administered 2016-06-10: 1 mg via INTRAVENOUS
  Filled 2016-06-10: qty 1

## 2016-06-10 MED ORDER — FENTANYL CITRATE (PF) 100 MCG/2ML IJ SOLN
INTRAMUSCULAR | Status: AC
  Filled 2016-06-10: qty 2

## 2016-06-10 MED ORDER — 0.9 % SODIUM CHLORIDE (POUR BTL) OPTIME
TOPICAL | Status: DC | PRN
  Administered 2016-06-10: 1000 mL

## 2016-06-10 MED ORDER — MIDAZOLAM HCL 5 MG/5ML IJ SOLN
INTRAMUSCULAR | Status: DC | PRN
  Administered 2016-06-10: 2 mg via INTRAVENOUS

## 2016-06-10 MED ORDER — ACETAMINOPHEN 10 MG/ML IV SOLN
1000.0000 mg | Freq: Once | INTRAVENOUS | Status: AC
  Administered 2016-06-10: 1000 mg via INTRAVENOUS
  Filled 2016-06-10: qty 100

## 2016-06-10 MED ORDER — CEFAZOLIN SODIUM-DEXTROSE 2-4 GM/100ML-% IV SOLN
2.0000 g | Freq: Four times a day (QID) | INTRAVENOUS | Status: AC
  Administered 2016-06-10 – 2016-06-11 (×2): 2 g via INTRAVENOUS
  Filled 2016-06-10 (×2): qty 100

## 2016-06-10 MED ORDER — KETOROLAC TROMETHAMINE 0.5 % OP SOLN
1.0000 [drp] | Freq: Three times a day (TID) | OPHTHALMIC | Status: DC | PRN
  Administered 2016-06-10: 1 [drp] via OPHTHALMIC
  Filled 2016-06-10: qty 3

## 2016-06-10 MED ORDER — METHOCARBAMOL 1000 MG/10ML IJ SOLN
500.0000 mg | Freq: Four times a day (QID) | INTRAVENOUS | Status: DC | PRN
  Filled 2016-06-10: qty 5

## 2016-06-10 MED ORDER — TRANEXAMIC ACID 1000 MG/10ML IV SOLN
1000.0000 mg | INTRAVENOUS | Status: AC
  Administered 2016-06-10: 1000 mg via INTRAVENOUS
  Filled 2016-06-10: qty 1100

## 2016-06-10 MED ORDER — BUPIVACAINE HCL (PF) 0.25 % IJ SOLN
INTRAMUSCULAR | Status: DC | PRN
  Administered 2016-06-10: 30 mL

## 2016-06-10 MED ORDER — HYDROMORPHONE HCL 1 MG/ML IJ SOLN: 0.2500 mg | INTRAMUSCULAR | Status: DC | PRN

## 2016-06-10 MED ORDER — PROPOFOL 10 MG/ML IV BOLUS
INTRAVENOUS | Status: DC | PRN
  Administered 2016-06-10: 30 mg via INTRAVENOUS

## 2016-06-10 MED ORDER — TRAMADOL HCL 50 MG PO TABS: 50.0000 mg | ORAL_TABLET | Freq: Four times a day (QID) | ORAL | Status: DC | PRN

## 2016-06-10 MED ORDER — LACTATED RINGERS IV SOLN
INTRAVENOUS | Status: DC
Start: 2016-06-10 — End: 2016-06-10
  Administered 2016-06-10 (×2): via INTRAVENOUS

## 2016-06-10 MED ORDER — ONDANSETRON HCL 4 MG/2ML IJ SOLN: 4.0000 mg | Freq: Four times a day (QID) | INTRAMUSCULAR | Status: DC | PRN

## 2016-06-10 MED ORDER — BUPIVACAINE HCL (PF) 0.25 % IJ SOLN
INTRAMUSCULAR | Status: AC
  Filled 2016-06-10: qty 30

## 2016-06-10 MED ORDER — BISACODYL 10 MG RE SUPP: 10.0000 mg | Freq: Every day | RECTAL | Status: DC | PRN

## 2016-06-10 MED ORDER — FLEET ENEMA 7-19 GM/118ML RE ENEM: 1.0000 | ENEMA | Freq: Once | RECTAL | Status: DC | PRN

## 2016-06-10 MED ORDER — ACETAMINOPHEN 10 MG/ML IV SOLN
INTRAVENOUS | Status: AC
  Filled 2016-06-10: qty 100

## 2016-06-10 MED ORDER — RIVAROXABAN 10 MG PO TABS
10.0000 mg | ORAL_TABLET | Freq: Every day | ORAL | Status: DC
  Administered 2016-06-11: 10 mg via ORAL
  Filled 2016-06-10: qty 1

## 2016-06-10 MED ORDER — METHOCARBAMOL 500 MG PO TABS
500.0000 mg | ORAL_TABLET | Freq: Four times a day (QID) | ORAL | Status: DC | PRN
  Administered 2016-06-10: 500 mg via ORAL
  Filled 2016-06-10: qty 1

## 2016-06-10 SURGICAL SUPPLY — 34 items
BAG DECANTER FOR FLEXI CONT (MISCELLANEOUS) ×3 IMPLANT
BAG SPEC THK2 15X12 ZIP CLS (MISCELLANEOUS) ×1
BAG ZIPLOCK 12X15 (MISCELLANEOUS) ×2 IMPLANT
BLADE SAG 18X100X1.27 (BLADE) ×3 IMPLANT
CAPT HIP TOTAL 2 ×2 IMPLANT
CLOSURE WOUND 1/2 X4 (GAUZE/BANDAGES/DRESSINGS) ×2
CLOTH BEACON ORANGE TIMEOUT ST (SAFETY) ×3 IMPLANT
COVER PERINEAL POST (MISCELLANEOUS) ×3 IMPLANT
DECANTER SPIKE VIAL GLASS SM (MISCELLANEOUS) ×3 IMPLANT
DRAPE STERI IOBAN 125X83 (DRAPES) ×3 IMPLANT
DRAPE U-SHAPE 47X51 STRL (DRAPES) ×6 IMPLANT
DRSG ADAPTIC 3X8 NADH LF (GAUZE/BANDAGES/DRESSINGS) ×3 IMPLANT
DRSG MEPILEX BORDER 4X4 (GAUZE/BANDAGES/DRESSINGS) ×3 IMPLANT
DRSG MEPILEX BORDER 4X8 (GAUZE/BANDAGES/DRESSINGS) ×3 IMPLANT
DURAPREP 26ML APPLICATOR (WOUND CARE) ×3 IMPLANT
ELECT REM PT RETURN 9FT ADLT (ELECTROSURGICAL) ×3
ELECTRODE REM PT RTRN 9FT ADLT (ELECTROSURGICAL) ×1 IMPLANT
EVACUATOR 1/8 PVC DRAIN (DRAIN) ×3 IMPLANT
GLOVE BIO SURGEON STRL SZ7.5 (GLOVE) ×3 IMPLANT
GLOVE BIO SURGEON STRL SZ8 (GLOVE) ×6 IMPLANT
GLOVE BIOGEL PI IND STRL 8 (GLOVE) ×2 IMPLANT
GLOVE BIOGEL PI INDICATOR 8 (GLOVE) ×4
GOWN STRL REUS W/TWL LRG LVL3 (GOWN DISPOSABLE) ×3 IMPLANT
GOWN STRL REUS W/TWL XL LVL3 (GOWN DISPOSABLE) ×3 IMPLANT
PACK ANTERIOR HIP CUSTOM (KITS) ×3 IMPLANT
STRIP CLOSURE SKIN 1/2X4 (GAUZE/BANDAGES/DRESSINGS) ×3 IMPLANT
SUT ETHIBOND NAB CT1 #1 30IN (SUTURE) ×3 IMPLANT
SUT MNCRL AB 4-0 PS2 18 (SUTURE) ×3 IMPLANT
SUT VIC AB 2-0 CT1 27 (SUTURE) ×6
SUT VIC AB 2-0 CT1 TAPERPNT 27 (SUTURE) ×2 IMPLANT
SUT VLOC 180 0 24IN GS25 (SUTURE) ×3 IMPLANT
SYR 50ML LL SCALE MARK (SYRINGE) IMPLANT
TRAY FOLEY W/METER SILVER 16FR (SET/KITS/TRAYS/PACK) ×3 IMPLANT
YANKAUER SUCT BULB TIP 10FT TU (MISCELLANEOUS) ×3 IMPLANT

## 2016-06-10 NOTE — Anesthesia Procedure Notes (Signed)
Spinal  Patient location during procedure: OR Start time: 06/10/2016 2:40 PM End time: 06/10/2016 2:50 PM Reason for block: at surgeon's request Staffing Resident/CRNA: Anne Fu Performed: resident/CRNA  Preanesthetic Checklist Completed: patient identified, site marked, surgical consent, pre-op evaluation, timeout performed, IV checked, risks and benefits discussed and monitors and equipment checked Spinal Block Patient position: sitting Prep: Betadine Patient monitoring: heart rate, continuous pulse ox and blood pressure Approach: midline Location: L2-3 Injection technique: single-shot Needle Needle type: [place]  Needle gauge: 25 G Needle length: 9 cm Assessment Sensory level: T6 Additional Notes Expiration date of kit checked and confirmed. Patient tolerated procedure well, without complications. X2. Loss of motor and sensory on exam post injection. First attempt right paramedian without success converted to midline noted clear CSF no paresthesia.

## 2016-06-10 NOTE — Anesthesia Preprocedure Evaluation (Addendum)
Anesthesia Evaluation  Patient identified by MRN, date of birth, ID band Patient awake    Reviewed: Allergy & Precautions, H&P , NPO status , Patient's Chart, lab work & pertinent test results  Airway Mallampati: III  TM Distance: >3 FB Neck ROM: Full    Dental no notable dental hx. (+) Teeth Intact, Dental Advisory Given   Pulmonary neg pulmonary ROS,    Pulmonary exam normal breath sounds clear to auscultation       Cardiovascular hypertension, Pt. on medications  Rhythm:Regular Rate:Normal     Neuro/Psych negative neurological ROS  negative psych ROS   GI/Hepatic negative GI ROS, Neg liver ROS,   Endo/Other  negative endocrine ROS  Renal/GU negative Renal ROS  negative genitourinary   Musculoskeletal  (+) Arthritis , Osteoarthritis,    Abdominal   Peds  Hematology negative hematology ROS (+) JEHOVAH'S WITNESS  Anesthesia Other Findings   Reproductive/Obstetrics negative OB ROS                            Anesthesia Physical Anesthesia Plan  ASA: II  Anesthesia Plan: MAC and Spinal   Post-op Pain Management:    Induction: Intravenous  Airway Management Planned: Simple Face Mask  Additional Equipment:   Intra-op Plan:   Post-operative Plan:   Informed Consent: I have reviewed the patients History and Physical, chart, labs and discussed the procedure including the risks, benefits and alternatives for the proposed anesthesia with the patient or authorized representative who has indicated his/her understanding and acceptance.   Dental advisory given  Plan Discussed with: CRNA  Anesthesia Plan Comments:         Anesthesia Quick Evaluation

## 2016-06-10 NOTE — Interval H&P Note (Signed)
History and Physical Interval Note:  06/10/2016 1:57 PM  Christopher Gibbs.  has presented today for surgery, with the diagnosis of RIGHT HIP OA  The various methods of treatment have been discussed with the patient and family. After consideration of risks, benefits and other options for treatment, the patient has consented to  Procedure(s): RIGHT TOTAL HIP ARTHROPLASTY ANTERIOR APPROACH (Right) as a surgical intervention .  The patient's history has been reviewed, patient examined, no change in status, stable for surgery.  I have reviewed the patient's chart and labs.  Questions were answered to the patient's satisfaction.     Gearlean Alf

## 2016-06-10 NOTE — Transfer of Care (Signed)
Immediate Anesthesia Transfer of Care Note  Patient: Christopher Gibbs.  Procedure(s) Performed: Procedure(s): RIGHT TOTAL HIP ARTHROPLASTY ANTERIOR APPROACH (Right)  Patient Location: PACU  Anesthesia Type:Spinal  Level of Consciousness:  sedated, patient cooperative and responds to stimulation  Airway & Oxygen Therapy:Patient Spontanous Breathing and Patient connected to face mask oxgen  Post-op Assessment:  Report given to PACU RN and Post -op Vital signs reviewed and stable  Post vital signs:  Reviewed and stable  Last Vitals:  Vitals:   06/10/16 1233  BP: (!) 165/90  Pulse: 70  Resp: 20  Temp: [id number] C    Complications: No apparent anesthesia complications

## 2016-06-10 NOTE — Anesthesia Postprocedure Evaluation (Signed)
Anesthesia Post Note  Patient: Christopher Gibbs.  Procedure(s) Performed: Procedure(s) (LRB): RIGHT TOTAL HIP ARTHROPLASTY ANTERIOR APPROACH (Right)  Patient location during evaluation: PACU Anesthesia Type: Spinal and MAC Level of consciousness: awake and alert Pain management: pain level controlled Vital Signs Assessment: post-procedure vital signs reviewed and stable Respiratory status: spontaneous breathing and respiratory function stable Cardiovascular status: blood pressure returned to baseline and stable Postop Assessment: spinal receding Anesthetic complications: no    Last Vitals:  Vitals:   06/10/16 1745 06/10/16 1802  BP: (!) 141/78 121/69  Pulse: (!) 51 (!) 49  Resp: (!) 9 16  Temp:  36.4 C    Last Pain:  Vitals:   06/10/16 1715  TempSrc:   PainSc: 0-No pain                 Sevyn Paredez,W. EDMOND

## 2016-06-10 NOTE — Op Note (Signed)
OPERATIVE REPORT- TOTAL HIP ARTHROPLASTY   PREOPERATIVE DIAGNOSIS: Osteoarthritis of the Right hip.   POSTOPERATIVE DIAGNOSIS: Osteoarthritis of the Right  hip.   PROCEDURE: Right total hip arthroplasty, anterior approach.   SURGEON: Gaynelle Arabian, MD   ASSISTANT: Arlee Muslim, PA-C  ANESTHESIA:  Spinal  ESTIMATED BLOOD LOSS:-600 ml   DRAINS: Hemovac x1.   COMPLICATIONS: None   CONDITION: PACU - hemodynamically stable.   BRIEF CLINICAL NOTE: Christopher Gibbs. is a 70 y.o. male who has advanced end-  stage arthritis of their Right  hip with progressively worsening pain and  dysfunction.The patient has failed nonoperative management and presents for  total hip arthroplasty.   PROCEDURE IN DETAIL: After successful administration of spinal  anesthetic, the traction boots for the [facility] bed were placed on both  feet and the patient was placed onto the [facility] bed, boots placed into the leg  holders. The Right hip was then isolated from the perineum with plastic  drapes and prepped and draped in the usual sterile fashion. ASIS and  greater trochanter were marked and a oblique incision was made, starting  at about 1 cm lateral and 2 cm distal to the ASIS and coursing towards  the anterior cortex of the femur. The skin was cut with a 10 blade  through subcutaneous tissue to the level of the fascia overlying the  tensor fascia lata muscle. The fascia was then incised in line with the  incision at the junction of the anterior third and posterior 2/3rd. The  muscle was teased off the fascia and then the interval between the TFL  and the rectus was developed. The Hohmann retractor was then placed at  the top of the femoral neck over the capsule. The vessels overlying the  capsule were cauterized and the fat on top of the capsule was removed.  A Hohmann retractor was then placed anterior underneath the rectus  femoris to give exposure to the entire anterior capsule. A T-shaped   capsulotomy was performed. The edges were tagged and the femoral head  was identified.       Osteophytes are removed off the superior acetabulum.  The femoral neck was then cut in situ with an oscillating saw. Traction  was then applied to the left lower extremity utilizing the [facility]  traction. The femoral head was then removed. Retractors were placed  around the acetabulum and then circumferential removal of the labrum was  performed. Osteophytes were also removed. Reaming starts at 47 mm to  medialize and  Increased in 2 mm increments to 53 mm. We reamed in  approximately 40 degrees of abduction, 20 degrees anteversion. A 54 mm  pinnacle acetabular shell was then impacted in anatomic position under  fluoroscopic guidance with excellent purchase. We did not need to place  any additional dome screws. A 36 mm neutral + 4 marathon liner was then  placed into the acetabular shell.       The femoral lift was then placed along the lateral aspect of the femur  just distal to the vastus ridge. The leg was  externally rotated and capsule  was stripped off the inferior aspect of the femoral neck down to the  level of the lesser trochanter, this was done with electrocautery. The femur was lifted after this was performed. The  leg was then placed in an extended and adducted position essentially delivering the femur. We also removed the capsule superiorly and the piriformis from the piriformis  fossa to gain excellent exposure of the  proximal femur. Rongeur was used to remove some cancellous bone to get  into the lateral portion of the proximal femur for placement of the  initial starter reamer. The starter broaches was placed  the starter broach  and was shown to go down the center of the canal. Broaching  with the  Corail system was then performed starting at size 8, coursing  Up to size 13. A size 13 had excellent torsional and rotational  and axial stability. The trial high offset neck was then  placed  with a 36 + 5 trial head. The hip was then reduced. We confirmed that  the stem was in the canal both on AP and lateral x-rays. It also has excellent sizing. The hip was reduced with outstanding stability through full extension and full external rotation.. AP pelvis was taken and the leg lengths were measured and found to be equal. Hip was then dislocated again and the femoral head and neck removed. The  femoral broach was removed. Size 13 Corail stem with a high offset  neck was then impacted into the femur following native anteversion. Has  excellent purchase in the canal. Excellent torsional and rotational and  axial stability. It is confirmed to be in the canal on AP and lateral  fluoroscopic views. The 36 + 5 ceramic head was placed and the hip  reduced with outstanding stability. Again AP pelvis was taken and it  confirmed that the leg lengths were equal. The wound was then copiously  irrigated with saline solution and the capsule reattached and repaired  with Ethibond suture. 30 ml of .25% Bupivicaine was  injected into the capsule and into the edge of the tensor fascia lata as well as subcutaneous tissue. The fascia overlying the tensor fascia lata was then closed with a running #1 V-Loc. Subcu was closed with interrupted 2-0 Vicryl and subcuticular running 4-0 Monocryl. Incision was cleaned  and dried. Steri-Strips and a bulky sterile dressing applied. Hemovac  drain was hooked to suction and then the patient was awakened and transported to  recovery in stable condition.        Please note that a surgical assistant was a medical necessity for this procedure to perform it in a safe and expeditious manner. Assistant was necessary to provide appropriate retraction of vital neurovascular structures and to prevent femoral fracture and allow for anatomic placement of the prosthesis.  Gaynelle Arabian, M.D.

## 2016-06-10 NOTE — Addendum Note (Signed)
Addendum  created 06/10/16 2124 by Anne Fu, CRNA   Order sets accessed

## 2016-06-10 NOTE — H&P (View-Only) (Signed)
Christopher Gibbs DOB: 1946/02/12 Married / Language: English / Race: [place] Male Date of Admission:  06/10/2016 CC:  Right Hip Pain History of Present Illness The patient is a 70 year old male who comes in for a preoperative History and Physical. The patient is scheduled for a right total hip arthroplasty (anterior) to be performed by Dr. Dione Plover. [name], MD at [facility] on 06-10-2016. The patient is a 69 year old male who presents today for follow up of their hip. The patient is being followed for their right hip pain. Symptoms reported include: pain and pain with weightbearing. The patient feels that they are doing poorly and report their pain level to be severe. The patient presented following IA hip injection. He states that the IA injection did help for about 3 weeks but now his hip is hurting again. He was prescribed meloxicam but did not take it long because he did not feel that it was helping. [name] says the hip is getting much worse at this time. He went away on vacation recently and could barely walk. Pain is in the groin radiating down the anterior thigh. He is losing more mobility. We reviewed the MRI scan. He has got a fairly impressive stress reaction femoral head and neck with the area of possible tiny collapse. He also has a moderate-sized effusion. There are degenerative changes superolaterally. He is ready to go ahead and get this fixed. They have been treated conservatively in the past for the above stated problem and despite conservative measures, they continue to have progressive pain and severe functional limitations and dysfunction. They have failed non-operative management including home exercise, medications, and injections. It is felt that they would benefit from undergoing total joint replacement. Risks and benefits of the procedure have been discussed with the patient and they elect to proceed with surgery. There are no active contraindications to surgery such as  ongoing infection or rapidly progressive neurological disease.  Problem List/Past Medical Hamstring tendinitis of right thigh [id number])  Primary osteoarthritis of right hip (M16.11)  Trochanteric bursitis of right hip (M70.61)  Bronchitis  Pneumonia  Hypertension  Hemorrhoids  Measles  Allergies No Known Drug Allergies  Family History Cancer  Father. First Degree Relatives  reported Heart Disease  Mother. Heart disease in male family member before age 55   Social History Children  2 Current drinker  12/07/2015: Currently drinks beer 5-7 times per week Current work status  working full time Exercise  Exercises weekly; does running / walking and gym / [employer] Living situation  live with spouse Marital status  married No history of drug/alcohol rehab  Not under pain contract  Number of flights of stairs before winded  4-5 Tobacco use  Never smoker. 12/07/2015 Advance Directives  Living Will [place] with Wife following the Right Total Hip to be performed on 06/10/2016 by Dr. Wynelle Link.  Medication History AmLODIPine Besylate (2.5MG  Tablet, Oral) Active.  Past Surgical History Arthroscopy of Knee  bilateral Left Knee Surgery  Right Knee Surgery    Review of Systems General Not Present- Chills, Fatigue, Fever, Memory Loss, Night Sweats, Weight Gain and Weight Loss. Skin Not Present- Eczema, Hives, Itching, Lesions and Rash. HEENT Not Present- Dentures, Double Vision, Headache, Hearing Loss, Tinnitus and Visual Loss. Respiratory Not Present- Allergies, Chronic Cough, Coughing up blood, Shortness of breath at rest and Shortness of breath with exertion. Cardiovascular Not Present- Chest Pain, Difficulty Breathing Lying Down, Murmur, Palpitations, Racing/skipping heartbeats and Swelling. Gastrointestinal  Not Present- Abdominal Pain, Bloody Stool, Constipation, Diarrhea, Difficulty Swallowing, Heartburn, Jaundice, Loss of appetitie,  Nausea and Vomiting. Male Genitourinary Not Present- Blood in Urine, Discharge, Flank Pain, Incontinence, Painful Urination, Urgency, Urinary frequency, Urinary Retention, Urinating at Night and Weak urinary stream. Musculoskeletal Present- Joint Pain. Not Present- Back Pain, Joint Swelling, Morning Stiffness, Muscle Pain, Muscle Weakness and Spasms. Neurological Not Present- Blackout spells, Difficulty with balance, Dizziness, Paralysis, Tremor and Weakness. Psychiatric Not Present- Insomnia.  Vitals  Weight: 245 lb Height: 71in Weight was reported by patient. Height was reported by patient. Body Surface Area: 2.3 m Body Mass Index: 34.17 kg/m  Pulse: 68 (Regular)  BP: 168/92 (Sitting, Left Arm, Standard)  Physical Exam  General Mental Status -Alert, cooperative and good historian. General Appearance-pleasant, Not in acute distress. Orientation-Oriented X3. Build & Nutrition-Well nourished and Well developed.  Head and Neck Head-normocephalic, atraumatic . Neck Global Assessment - supple, no bruit auscultated on the right, no bruit auscultated on the left.  Eye Pupil - Bilateral-Regular and Round. Motion - Bilateral-EOMI.  Chest and Lung Exam Auscultation Breath sounds - clear at anterior chest wall and clear at posterior chest wall. Adventitious sounds - No Adventitious sounds.  Cardiovascular Auscultation Rhythm - Regular rate and rhythm. Heart Sounds - S1 WNL and S2 WNL. Murmurs & Other Heart Sounds - Auscultation of the heart reveals - No Murmurs.  Abdomen Palpation/Percussion Tenderness - Abdomen is non-tender to palpation. Rigidity (guarding) - Abdomen is soft. Auscultation Auscultation of the abdomen reveals - Bowel sounds normal.  Male Genitourinary Note: Not done, not pertinent to present illness   Musculoskeletal Note: A well developed male in no distress. His right hip can be flexed to 100, no internal rotation, about 10 of  external rotation, 10 of abduction. Left hip has normal range of motion. He has a significantly antalgic gait pattern on the right. There is no tenderness about the hip.  IMAGING AP pelvis and lateral of the right hip show his femoral head is now collapsed. He has a large area, probably about 40 to 50% of the femoral head with collapse at this time.  Assessment & Plan Primary osteoarthritis of right hip (M16.11)  Note:Surgical Plans: Right Total Hip Replacement - Anterior Approach  Disposition: Homewith Wife  PCP: Dr. Scarlette Shorts  IV TXA  Anesthesia Issues: None  Signed electronically by Ok Edwards, III PA-C

## 2016-06-11 ENCOUNTER — Encounter (HOSPITAL_COMMUNITY): Payer: Self-pay | Admitting: Orthopedic Surgery

## 2016-06-11 LAB — BASIC METABOLIC PANEL
Anion gap: 7 (ref 5–15)
BUN: 13 mg/dL (ref 6–20)
CHLORIDE: 103 mmol/L (ref 101–111)
CO2: 25 mmol/L (ref 22–32)
CREATININE: 0.83 mg/dL (ref 0.61–1.24)
Calcium: 8.7 mg/dL — ABNORMAL LOW (ref 8.9–10.3)
GFR calc Af Amer: >60 mL/min (ref >60)
Glucose, Bld: 164 mg/dL — ABNORMAL HIGH (ref 65–99)
Potassium: 4.4 mmol/L (ref 3.5–5.1)
SODIUM: 135 mmol/L (ref 135–145)

## 2016-06-11 LAB — CBC
HCT: 35.7 % — ABNORMAL LOW (ref 39.0–52.0)
Hemoglobin: 12.7 g/dL — ABNORMAL LOW (ref 13.0–17.0)
MCH: 34.2 pg — AB (ref 26.0–34.0)
MCHC: 35.6 g/dL (ref 30.0–36.0)
MCV: 96.2 fL (ref 78.0–100.0)
PLATELETS: 152 10*3/uL (ref 150–400)
RBC: 3.71 MIL/uL — ABNORMAL LOW (ref 4.22–5.81)
RDW: 12.9 % (ref 11.5–15.5)
WBC: 7.6 10*3/uL (ref 4.0–10.5)

## 2016-06-11 MED ORDER — TRAMADOL HCL 50 MG PO TABS: 50.0000 mg | ORAL_TABLET | Freq: Four times a day (QID) | ORAL | 1 refills | Status: DC | PRN

## 2016-06-11 MED ORDER — SODIUM CHLORIDE 0.9 % IV BOLUS (SEPSIS)
250.0000 mL | Freq: Once | INTRAVENOUS | Status: AC
  Administered 2016-06-11: 250 mL via INTRAVENOUS

## 2016-06-11 MED ORDER — RIVAROXABAN 10 MG PO TABS: 10.0000 mg | ORAL_TABLET | Freq: Every day | ORAL | 0 refills | Status: DC

## 2016-06-11 MED ORDER — METHOCARBAMOL 500 MG PO TABS: 500.0000 mg | ORAL_TABLET | Freq: Four times a day (QID) | ORAL | 0 refills | Status: DC | PRN

## 2016-06-11 MED ORDER — OXYCODONE HCL 5 MG PO TABS: 5.0000 mg | ORAL_TABLET | ORAL | 0 refills | Status: DC | PRN

## 2016-06-11 NOTE — Evaluation (Signed)
Occupational Therapy Evaluation Patient Details Name: Christopher Gibbs. MRN: [id number] DOB: 1946-01-29 Today's Date: 06/11/2016    History of Present Illness RIGHT TOTAL HIP ARTHROPLASTY ANTERIOR APPROACH (Right   Clinical Impression   Pt is s/p THA resulting in the deficits listed below (see OT Problem List).  Pt will benefit from skilled OT to increase their safety and independence with ADL and functional mobility for ADL to facilitate discharge to venue listed below.        Follow Up Recommendations  No OT follow up    Equipment Recommendations  3 in 1 bedside comode       Precautions / Restrictions Precautions Precautions: Anterior Hip Restrictions Weight Bearing Restrictions: Yes RLE Weight Bearing: Weight bearing as tolerated      Mobility Bed Mobility Overal bed mobility: Needs Assistance Bed Mobility: Supine to Sit     Supine to sit: Min assist        Transfers Overall transfer level: Needs assistance Equipment used: Rolling walker (2 wheeled) Transfers: Sit to/from [facility] Sit to Stand: Min assist Stand pivot transfers: Min assist                 ADL Overall ADL's : Needs assistance/impaired Eating/Feeding: Set up;Sitting   Grooming: Set up;Sitting   Upper Body Bathing: Set up;Sitting   Lower Body Bathing: Minimal assistance;Sit to/from stand   Upper Body Dressing : Set up;Sitting   Lower Body Dressing: Minimal assistance;Sit to/from stand   Toilet Transfer: Minimal assistance;RW;Ambulation;Cueing for sequencing;Cueing for safety   Toileting- Clothing Manipulation and Hygiene: Minimal assistance;Sit to/from stand;Cueing for safety;Cueing for sequencing       Functional mobility during ADLs: Minimal assistance;Cueing for safety;Cueing for sequencing;Rolling walker                 Pertinent Vitals/Pain Pain Assessment: 0-10 Pain Score: 2  Pain Descriptors / Indicators: Sore Pain Intervention(s):  Monitored during session        Extremity/Trunk Assessment Upper Extremity Assessment Upper Extremity Assessment: Overall WFL for tasks assessed           Communication Communication Communication: No difficulties   Cognition Arousal/Alertness: Awake/alert Behavior During Therapy: WFL for tasks assessed/performed Overall Cognitive Status: Within Functional Limits for tasks assessed                                Home Living Family/patient expects to be discharged to:: Private residence Living Arrangements: Spouse/significant other Available Help at Discharge: Family Type of Home: House Home Access: Stairs to enter     Home Layout: Two level Alternate Level Stairs-Number of Steps: 15   Bathroom Shower/Tub: Tub/shower unit;Walk-in shower   Bathroom Toilet: Standard     Home Equipment: None          Prior Functioning/Environment Level of Independence: Independent                 OT Problem List: Decreased strength;Decreased activity tolerance;Pain   OT Treatment/Interventions: Self-care/ADL training    OT Goals(Current goals can be found in the care plan section) Acute Rehab OT Goals Patient Stated Goal: home OT Goal Formulation: With patient Time For Goal Achievement: 06/18/16  OT Frequency: Min 2X/week              End of Session Equipment Utilized During Treatment: Surveyor, mining Communication: Mobility status  Activity Tolerance: Patient tolerated treatment well Patient left: in chair  Time: [id number] OT Time Calculation (min): 20 min Charges:  OT General Charges $OT Visit: 1 Procedure OT Evaluation $OT Eval Moderate Complexity: 1 Procedure G-Codes:    Betsy Pries 2016-06-13, 10:32 AM

## 2016-06-11 NOTE — Progress Notes (Signed)
Occupational Therapy Treatment Patient Details Name: Christopher Gibbs. MRN: [id number] DOB: 04-11-1946 Today's Date: 06/11/2016    History of present illness RIGHT TOTAL HIP ARTHROPLASTY ANTERIOR APPROACH (Right)   OT comments  OT education complete  Follow Up Recommendations  No OT follow up    Equipment Recommendations  3 in 1 bedside comode       Precautions / Restrictions Precautions Precautions: None Restrictions Weight Bearing Restrictions: No RLE Weight Bearing: Weight bearing as tolerated       Mobility Bed Mobility Overal bed mobility: Needs Assistance Bed Mobility: Supine to Sit     Supine to sit: Min assist     General bed mobility comments: OOB with OT  Transfers Overall transfer level: Needs assistance Equipment used: Rolling walker (2 wheeled) Transfers: Sit to/from [facility] Sit to Stand: Supervision Stand pivot transfers: Supervision       General transfer comment: cues for hand placement        ADL Overall ADL's : Needs assistance/impaired Eating/Feeding: Set up;Sitting   Grooming: Set up;Sitting   Upper Body Bathing: Set up;Sitting   Lower Body Bathing: Minimal assistance;Sit to/from stand   Upper Body Dressing : Set up;Sitting   Lower Body Dressing: Supervision/safety;Sit to/from stand;Cueing for safety;Cueing for sequencing   Toilet Transfer: Supervision/safety;RW;Ambulation;Cueing for sequencing;Cueing for safety   Toileting- Clothing Manipulation and Hygiene: Minimal assistance;Sit to/from stand;Cueing for safety;Cueing for sequencing   Tub/ Shower Transfer: Copy Details (indicate cue type and reason): verbalized safety- shower is upstairs.  Will await guidance of PT before performing steps Functional mobility during ADLs: Supervision/safety;Rolling walker;Cueing for sequencing;Cueing for safety                  Cognition   Behavior During Therapy: [facility] for tasks  assessed/performed Overall Cognitive Status: Within Functional Limits for tasks assessed                       Extremity/Trunk Assessment  Upper Extremity Assessment Upper Extremity Assessment: Defer to OT evaluation;Overall [facility] for tasks assessed   Lower Extremity Assessment Lower Extremity Assessment: RLE deficits/detail RLE Deficits / Details: hip flexion 3/5; knee grossly [facility]        Exercises Total Joint Exercises Ankle Circles/Pumps: AROM;Both;10 reps Quad Sets: 10 reps;AROM;Both   Shoulder Instructions       General Comments      Pertinent Vitals/ Pain       Pain Assessment: 0-10 Pain Score: 3  Pain Descriptors / Indicators: Sore Pain Intervention(s): Monitored during session;Repositioned;Ice applied  Home Living Family/patient expects to be discharged to:: Private residence Living Arrangements: Spouse/significant other Available Help at Discharge: Family Type of Home: House Home Access: Stairs to enter   Entrance Stairs-Rails: Right Home Layout: Two level Alternate Level Stairs-Number of Steps: 15 (12 and 3 more)   Bathroom Shower/Tub: Tub/shower unit;Walk-in shower   Bathroom Toilet: Standard     Home Equipment: Cane - single point          Prior Functioning/Environment Level of Independence: Independent            Frequency  Min 2X/week        Progress Toward Goals  OT Goals(current goals can now be found in the care plan section)  Progress towards OT goals: Progressing toward goals  Acute Rehab OT Goals Patient Stated Goal: home OT Goal Formulation: With patient Time For Goal Achievement: 06/18/16  Plan      Co-evaluation  End of Session Equipment Utilized During Treatment: Rolling walker   Activity Tolerance Patient tolerated treatment well   Patient Left in chair   Nurse Communication Mobility status        Time: 1120-1135 OT Time Calculation (min): 15 min  Charges: OT General  Charges $OT Visit: 1 Procedure OT Evaluation $OT Eval Moderate Complexity: 1 Procedure OT Treatments $Self Care/Home Management : 8-22 mins  Amrutha Avera D 06/11/2016, 12:15 PM

## 2016-06-11 NOTE — Discharge Summary (Signed)
Physician Discharge Summary   Patient ID: Christopher Gibbs. MRN: [id number] DOB/AGE: [birth year] 70 y.o.  Admit date: 06/10/2016 Discharge date: 06-11-2016  Primary Diagnosis:   Osteoarthritis of the Right hip.   Admission Diagnoses:  Past Medical History:  Diagnosis Date  . Arthritis    Arthritis knees- rt. hip  . Hypertension    tends to elevate in MD office.   Discharge Diagnoses:   Principal Problem:   OA (osteoarthritis) of hip  Estimated body mass index is 33.61 kg/m as calculated from the following:   Height as of this encounter: _0  (1.803 m).   Weight as of this encounter: 109.3 kg (241 lb).  Procedure(s) (LRB): RIGHT TOTAL HIP ARTHROPLASTY ANTERIOR APPROACH (Right)   Consults: None  HPI: Christopher Gibbs. is a 70 y.o. male who has advanced end-  stage arthritis of their Right  hip with progressively worsening pain and  dysfunction.The patient has failed nonoperative management and presents for  total hip arthroplasty.   Laboratory Data: Admission on 06/10/2016  Component Date Value Ref Range Status  . WBC 06/11/2016 7.6  4.0 - 10.5 K/uL Final  . RBC 06/11/2016 3.71* 4.22 - 5.81 MIL/uL Final  . Hemoglobin 06/11/2016 12.7* 13.0 - 17.0 g/dL Final  . HCT 06/11/2016 35.7* 39.0 - 52.0 % Final  . MCV 06/11/2016 96.2  78.0 - 100.0 fL Final  . MCH 06/11/2016 34.2* 26.0 - 34.0 pg Final  . MCHC 06/11/2016 35.6  30.0 - 36.0 g/dL Final  . RDW 06/11/2016 12.9  11.5 - 15.5 % Final  . Platelets 06/11/2016 152  150 - 400 K/uL Final  . Sodium 06/11/2016 135  135 - 145 mmol/L Final  . Potassium 06/11/2016 4.4  3.5 - 5.1 mmol/L Final  . Chloride 06/11/2016 103  101 - 111 mmol/L Final  . CO2 06/11/2016 25  22 - 32 mmol/L Final  . Glucose, Bld 06/11/2016 164* 65 - 99 mg/dL Final  . BUN 06/11/2016 13  6 - 20 mg/dL Final  . Creatinine, Ser 06/11/2016 0.83  0.61 - 1.24 mg/dL Final  . Calcium 06/11/2016 8.7* 8.9 - 10.3 mg/dL Final  . GFR calc non Af Amer 06/11/2016 >60   >60 mL/min Final  . GFR calc Af Amer 06/11/2016 >60  >60 mL/min Final   Comment: (NOTE) The eGFR has been calculated using the CKD EPI equation. This calculation has not been validated in all clinical situations. eGFR's persistently <60 mL/min signify possible Chronic Kidney Disease.   Georgiann Hahn gap 06/11/2016 7  5 - 15 Final  Hospital Outpatient Visit on 06/04/2016  Component Date Value Ref Range Status  . MRSA, PCR 06/04/2016 NEGATIVE  NEGATIVE Final  . Staphylococcus aureus 06/04/2016 NEGATIVE  NEGATIVE Final   Comment:        The Xpert SA Assay (FDA approved for NASAL specimens in patients over 23 years of age), is one component of a comprehensive surveillance program.  Test performance has been validated by [facility] for patients greater than or equal to 67 year old. It is not intended to diagnose infection nor to guide or monitor treatment.   Marland Kitchen aPTT 06/04/2016 34  24 - 36 seconds Final  . Prothrombin Time 06/04/2016 12.9  11.4 - 15.2 seconds Final  . INR 06/04/2016 0.97   Final  . ABO/RH(D) 06/10/2016 O NEG   Final  . Antibody Screen 06/10/2016 NEG   Final  . Sample Expiration 06/10/2016 06/13/2016   Final  . Extend sample  reason 06/10/2016 NO TRANSFUSIONS OR PREGNANCY IN THE PAST 3 MONTHS   Final  . Color, Urine 06/04/2016 YELLOW  YELLOW Final  . APPearance 06/04/2016 CLOUDY* CLEAR Final  . Specific Gravity, Urine 06/04/2016 1.019  1.005 - 1.030 Final  . pH 06/04/2016 6.5  5.0 - 8.0 Final  . Glucose, UA 06/04/2016 NEGATIVE  NEGATIVE mg/dL Final  . Hgb urine dipstick 06/04/2016 NEGATIVE  NEGATIVE Final  . Bilirubin Urine 06/04/2016 NEGATIVE  NEGATIVE Final  . Ketones, ur 06/04/2016 NEGATIVE  NEGATIVE mg/dL Final  . Protein, ur 06/04/2016 30* NEGATIVE mg/dL Final  . Nitrite 06/04/2016 NEGATIVE  NEGATIVE Final  . Leukocytes, UA 06/04/2016 NEGATIVE  NEGATIVE Final  . Squamous Epithelial / LPF 06/04/2016 0-5* NONE SEEN Final  . WBC, UA 06/04/2016 0-5  0 - 5 WBC/hpf  Final  . RBC / HPF 06/04/2016 0-5  0 - 5 RBC/hpf Final  . Bacteria, UA 06/04/2016 FEW* NONE SEEN Final  . Casts 06/04/2016 HYALINE CASTS* NEGATIVE Final  . Urine-Other 06/04/2016 SPERM PRESENT   Final  . ABO/RH(D) 06/04/2016 O NEG   Final     X-Rays:Dg Chest 2 View  Result Date: 05/28/2016 CLINICAL DATA:  Preop evaluation. EXAM: CHEST  2 VIEW COMPARISON:  None. FINDINGS: Normal heart size and mediastinal contours. No acute infiltrate or edema. No effusion or pneumothorax. No acute osseous findings. IMPRESSION: No active cardiopulmonary disease. Electronically Signed   By: Monte Fantasia M.D.   On: 05/28/2016 14:48   Dg Pelvis Portable  Result Date: 06/10/2016 CLINICAL DATA:  RIGHT hip replacement EXAM: DG C-ARM 61-120 MIN-NO REPORT; PORTABLE PELVIS 1-2 VIEWS COMPARISON:  Portable exam 1659 hours without priors for comparison. FLUOROSCOPY TIME:  0 minutes 16 seconds Radiation dose:  1.75 mGy FINDINGS: Single AP view. RIGHT hip prosthesis identified in expected position. No acute fracture, dislocation or bone destruction identified. Surgical drain present. IMPRESSION: RIGHT hip prosthesis without acute complication on single AP view. Electronically Signed   By: Lavonia Dana M.D.   On: 06/10/2016 17:15   Dg C-arm 61-120 Min-no Report  Result Date: 06/10/2016 CLINICAL DATA: hip C-ARM 61-120 MINUTES Fluoroscopy was utilized by the requesting physician.  No radiographic interpretation.    EKG:No orders found for this or any previous visit.   Hospital Course: Patient was admitted to [facility] and taken to the OR and underwent the above state procedure without complications.  Patient tolerated the procedure well and was later transferred to the recovery room and then to the orthopaedic floor for postoperative care.  They were given PO and IV analgesics for pain control following their surgery.  They were given 24 hours of postoperative antibiotics of  Anti-infectives    Start      Dose/Rate Route Frequency Ordered Stop   06/10/16 2100  ceFAZolin (ANCEF) IVPB 2g/100 mL premix     2 g 200 mL/hr over 30 Minutes Intravenous Every 6 hours 06/10/16 1806 06/11/16 0303   06/10/16 1300  ceFAZolin (ANCEF) IVPB 2g/100 mL premix     2 g 200 mL/hr over 30 Minutes Intravenous On call to O.R. 06/10/16 1235 06/10/16 1500     and started on DVT prophylaxis in the form of Xarelto.   PT and OT were ordered for total hip protocol.  The patient was allowed to be WBAT with therapy. Discharge planning was consulted to help with postop disposition and equipment needs.  Patient had a good night on the evening of surgery.  They started to get up OOB  with therapy on day one.  Hemovac drain was pulled without difficulty.  Dressing was checked and was clean and dry. Patient was seen in rounds on POD1, doing well, setup to go home and was ready to go later that same day following therapy.  Discharge home with home health Diet - Cardiac diet Follow up - in 2 weeks Activity - WBAT Disposition - Home Condition Upon Discharge - improving D/C Meds - See DC Summary DVT Prophylaxis - Xarelto  Discharge Instructions    Call MD / Call 911    Complete by:  As directed    If you experience chest pain or shortness of breath, CALL 911 and be transported to the hospital emergency room.  If you develope a fever above 101 F, pus ([place] drainage) or increased drainage or redness at the wound, or calf pain, call your surgeon's office.   Change dressing    Complete by:  As directed    You may change your dressing dressing daily with sterile 4 x 4 inch gauze dressing and paper tape.  Do not submerge the incision under water.   Constipation Prevention    Complete by:  As directed    Drink plenty of fluids.  Prune juice may be helpful.  You may use a stool softener, such as Colace (over the counter) 100 mg twice a day.  Use MiraLax (over the counter) for constipation as needed.   Diet - low sodium heart healthy     Complete by:  As directed    Discharge instructions    Complete by:  As directed    Pick up stool softner and laxative for home use following surgery while on pain medications. Do not submerge incision under water. Please use good hand washing techniques while changing dressing each day. May shower starting three days after surgery. Please use a clean towel to pat the incision dry following showers. Continue to use ice for pain and swelling after surgery. Do not use any lotions or creams on the incision until instructed by your surgeon.   Postoperative Constipation Protocol  Constipation - defined medically as fewer than three stools per week and severe constipation as less than one stool per week.  One of the most common issues patients have following surgery is constipation.  Even if you have a regular bowel pattern at home, your normal regimen is likely to be disrupted due to multiple reasons following surgery.  Combination of anesthesia, postoperative narcotics, change in appetite and fluid intake all can affect your bowels.  In order to avoid complications following surgery, here are some recommendations in order to help you during your recovery period.  Colace (docusate) - Pick up an over-the-counter form of Colace or another stool softener and take twice a day as long as you are requiring postoperative pain medications.  Take with a full glass of water daily.  If you experience loose stools or diarrhea, hold the colace until you stool forms back up.  If your symptoms do not get better within 1 week or if they get worse, check with your doctor.  Dulcolax (bisacodyl) - Pick up over-the-counter and take as directed by the product packaging as needed to assist with the movement of your bowels.  Take with a full glass of water.  Use this product as needed if not relieved by Colace only.   MiraLax (polyethylene glycol) - Pick up over-the-counter to have on hand.  MiraLax is a solution that  will increase the amount  of water in your bowels to assist with bowel movements.  Take as directed and can mix with a glass of water, juice, soda, coffee, or tea.  Take if you go more than two days without a movement. Do not use MiraLax more than once per day. Call your doctor if you are still constipated or irregular after using this medication for 7 days in a row.  If you continue to have problems with postoperative constipation, please contact the office for further assistance and recommendations.  If you experience "the worst abdominal pain ever" or develop nausea or vomiting, please contact the office immediatly for further recommendations for treatment.   Take Xarelto for two and a half more weeks, then discontinue Xarelto. Once the patient has completed the blood thinner regimen, then take a Baby 81 mg Aspirin daily for three more weeks.   Do not sit on low chairs, stoools or toilet seats, as it may be difficult to get up from low surfaces    Complete by:  As directed    Driving restrictions    Complete by:  As directed    No driving until released by the physician.   Increase activity slowly as tolerated    Complete by:  As directed    Lifting restrictions    Complete by:  As directed    No lifting until released by the physician.   Patient may shower    Complete by:  As directed    You may shower without a dressing once there is no drainage.  Do not wash over the wound.  If drainage remains, do not shower until drainage stops.   TED hose    Complete by:  As directed    Use stockings (TED hose) for 3 weeks on both leg(s).  You may remove them at night for sleeping.   Weight bearing as tolerated    Complete by:  As directed    Laterality:  right   Extremity:  Lower       Medication List    STOP taking these medications   naproxen sodium 220 MG tablet Commonly known as:  ANAPROX     TAKE these medications   amLODipine 2.5 MG tablet Commonly known as:  NORVASC Take 2.5  mg by mouth daily.   methocarbamol 500 MG tablet Commonly known as:  ROBAXIN Take 1 tablet (500 mg total) by mouth every 6 (six) hours as needed for muscle spasms.   oxyCODONE 5 MG immediate release tablet Commonly known as:  Oxy IR/ROXICODONE Take 1-2 tablets (5-10 mg total) by mouth every 3 (three) hours as needed for moderate pain or severe pain.   rivaroxaban 10 MG Tabs tablet Commonly known as:  XARELTO Take 1 tablet (10 mg total) by mouth daily with breakfast. Take Xarelto for two and a half more weeks, then discontinue Xarelto. Once the patient has completed the blood thinner regimen, then take a Baby 81 mg Aspirin daily for three more weeks. Start taking on:  06/12/2016   traMADol 50 MG tablet Commonly known as:  ULTRAM Take 1-2 tablets (50-100 mg total) by mouth every 6 (six) hours as needed (mild pain).      Follow-up Information    Gearlean Alf, MD. Schedule an appointment as soon as possible for a visit on 06/25/2016.   Specialty:  Orthopedic Surgery Why:  Call office at [phone] for appointment on Tuesday 06/25/16. Contact information: [address] [place] [address] [phone]  Signed: Arlee Muslim, PA-C Orthopaedic Surgery 06/11/2016, 8:52 AM

## 2016-06-11 NOTE — Discharge Instructions (Addendum)
° °Dr. Frank [name] °Total Joint Specialist °[facility] °[address] °[place], [place] [address] °[phone] ° °ANTERIOR APPROACH TOTAL HIP REPLACEMENT POSTOPERATIVE DIRECTIONS ° ° °Hip Rehabilitation, Guidelines Following Surgery  °The results of a hip operation are greatly improved after range of motion and muscle strengthening exercises. Follow all safety measures which are given to protect your hip. If any of these exercises cause increased pain or swelling in your joint, decrease the amount until you are comfortable again. Then slowly increase the exercises. Call your caregiver if you have problems or questions.  ° °HOME CARE INSTRUCTIONS  °Remove items at home which could result in a fall. This includes throw rugs or furniture in walking pathways.  °· ICE to the affected hip every three hours for 30 minutes at a time and then as needed for pain and swelling.  Continue to use ice on the hip for pain and swelling from surgery. You may notice swelling that will progress down to the foot and ankle.  This is normal after surgery.  Elevate the leg when you are not up walking on it.   °· Continue to use the breathing machine which will help keep your temperature down.  It is common for your temperature to cycle up and down following surgery, especially at night when you are not up moving around and exerting yourself.  The breathing machine keeps your lungs expanded and your temperature down. ° ° °DIET °You may resume your previous home diet once your are discharged from the hospital. ° °DRESSING / WOUND CARE / SHOWERING °You may shower 3 days after surgery, but keep the wounds dry during showering.  You may use an occlusive plastic wrap (Press'n Seal for example), NO SOAKING/SUBMERGING IN THE BATHTUB.  If the bandage gets wet, change with a clean dry gauze.  If the incision gets wet, pat the wound dry with a clean towel. °You may start showering once you are discharged home but do not  submerge the incision under water. Just pat the incision dry and apply a dry gauze dressing on daily. °Change the surgical dressing daily and reapply a dry dressing each time. ° °ACTIVITY °Walk with your walker as instructed. °Use walker as long as suggested by your caregivers. °Avoid periods of inactivity such as sitting longer than an hour when not asleep. This helps prevent blood clots.  °You may resume a sexual relationship in one month or when given the OK by your doctor.  °You may return to work once you are cleared by your doctor.  °Do not drive a car for 6 weeks or until released by you surgeon.  °Do not drive while taking narcotics. ° °WEIGHT BEARING °Weight bearing as tolerated with assist device (walker, cane, etc) as directed, use it as long as suggested by your surgeon or therapist, typically at least 4-6 weeks. ° °POSTOPERATIVE CONSTIPATION PROTOCOL °Constipation - defined medically as fewer than three stools per week and severe constipation as less than one stool per week. ° °One of the most common issues patients have following surgery is constipation.  Even if you have a regular bowel pattern at home, your normal regimen is likely to be disrupted due to multiple reasons following surgery.  Combination of anesthesia, postoperative narcotics, change in appetite and fluid intake all can affect your bowels.  In order to avoid complications following surgery, here are some recommendations in order to help you during your recovery period. ° °Colace (docusate) - Pick up an over-the-counter   form of Colace or another stool softener and take twice a day as long as you are requiring postoperative pain medications.  Take with a full glass of water daily.  If you experience loose stools or diarrhea, hold the colace until you stool forms back up.  If your symptoms do not get better within 1 week or if they get worse, check with your doctor. ° °Dulcolax (bisacodyl) - Pick up over-the-counter and take as directed  by the product packaging as needed to assist with the movement of your bowels.  Take with a full glass of water.  Use this product as needed if not relieved by Colace only.  ° °MiraLax (polyethylene glycol) - Pick up over-the-counter to have on hand.  MiraLax is a solution that will increase the amount of water in your bowels to assist with bowel movements.  Take as directed and can mix with a glass of water, juice, soda, coffee, or tea.  Take if you go more than two days without a movement. °Do not use MiraLax more than once per day. Call your doctor if you are still constipated or irregular after using this medication for 7 days in a row. ° °If you continue to have problems with postoperative constipation, please contact the office for further assistance and recommendations.  If you experience "the worst abdominal pain ever" or develop nausea or vomiting, please contact the office immediatly for further recommendations for treatment. ° °ITCHING ° If you experience itching with your medications, try taking only a single pain pill, or even half a pain pill at a time.  You can also use Benadryl over the counter for itching or also to help with sleep.  ° °TED HOSE STOCKINGS °Wear the elastic stockings on both legs for three weeks following surgery during the day but you may remove then at night for sleeping. ° °MEDICATIONS °See your medication summary on the “After Visit Summary” that the nursing staff will review with you prior to discharge.  You may have some home medications which will be placed on hold until you complete the course of blood thinner medication.  It is important for you to complete the blood thinner medication as prescribed by your surgeon.  Continue your approved medications as instructed at time of discharge. ° °PRECAUTIONS °If you experience chest pain or shortness of breath - call 911 immediately for transfer to the hospital emergency department.  °If you develop a fever greater that 101 F,  purulent drainage from wound, increased redness or drainage from wound, foul odor from the wound/dressing, or calf pain - CONTACT YOUR SURGEON.   °                                                °FOLLOW-UP APPOINTMENTS °Make sure you keep all of your appointments after your operation with your surgeon and caregivers. You should call the office at the above phone number and make an appointment for approximately two weeks after the date of your surgery or on the date instructed by your surgeon outlined in the "After Visit Summary". ° °RANGE OF MOTION AND STRENGTHENING EXERCISES  °These exercises are designed to help you keep full movement of your hip joint. Follow your caregiver's or physical therapist's instructions. Perform all exercises about fifteen times, three times per day or as directed. Exercise both hips, even if you   have had only one joint replacement. These exercises can be done on a training (exercise) mat, on the floor, on a table or on a bed. Use whatever works the best and is most comfortable for you. Use music or television while you are exercising so that the exercises are a pleasant break in your day. This will make your life better with the exercises acting as a break in routine you can look forward to.  Lying on your back, slowly slide your foot toward your buttocks, raising your knee up off the floor. Then slowly slide your foot back down until your leg is straight again.  Lying on your back spread your legs as far apart as you can without causing discomfort.  Lying on your side, raise your upper leg and foot straight up from the floor as far as is comfortable. Slowly lower the leg and repeat.  Lying on your back, tighten up the muscle in the front of your thigh (quadriceps muscles). You can do this by keeping your leg straight and trying to raise your heel off the floor. This helps strengthen the largest muscle supporting your knee.  Lying on your back, tighten up the muscles of your  buttocks both with the legs straight and with the knee bent at a comfortable angle while keeping your heel on the floor.   IF YOU ARE TRANSFERRED TO A SKILLED REHAB FACILITY If the patient is transferred to a skilled rehab facility following release from the hospital, a list of the current medications will be sent to the facility for the patient to continue.  When discharged from the skilled rehab facility, please have the facility set up the patient's [place] prior to being released. Also, the skilled facility will be responsible for providing the patient with their medications at time of release from the facility to include their pain medication, the muscle relaxants, and their blood thinner medication. If the patient is still at the rehab facility at time of the two week follow up appointment, the skilled rehab facility will also need to assist the patient in arranging follow up appointment in our office and any transportation needs.  MAKE SURE YOU:  Understand these instructions.  Get help right away if you are not doing well or get worse.    Pick up stool softner and laxative for home use following surgery while on pain medications. Do not submerge incision under water. Please use good hand washing techniques while changing dressing each day. May shower starting three days after surgery. Please use a clean towel to pat the incision dry following showers. Continue to use ice for pain and swelling after surgery. Do not use any lotions or creams on the incision until instructed by your surgeon.  Take Xarelto for two and a half more weeks, then discontinue Xarelto. Once the patient has completed the blood thinner regimen, then take a Baby 81 mg Aspirin daily for three more weeks.   Information on my medicine - XARELTO (Rivaroxaban)  This medication education was reviewed with me or my healthcare representative as part of my discharge preparation.  The pharmacist that  spoke with me during my hospital stay was:  Absher, Julieta Bellini, RPH  Why was Xarelto prescribed for you? Xarelto was prescribed for you to reduce the risk of blood clots forming after orthopedic surgery. The medical term for these abnormal blood clots is venous thromboembolism (VTE).  What do you need to know about xarelto ? Take your Xarelto  ONCE DAILY at the same time every day. You may take it either with or without food.  If you have difficulty swallowing the tablet whole, you may crush it and mix in applesauce just prior to taking your dose.  Take Xarelto exactly as prescribed by your doctor and DO NOT stop taking Xarelto without talking to the doctor who prescribed the medication.  Stopping without other VTE prevention medication to take the place of Xarelto may increase your risk of developing a clot.  After discharge, you should have regular check-up appointments with your healthcare provider that is prescribing your Xarelto.    What do you do if you miss a dose? If you miss a dose, take it as soon as you remember on the same day then continue your regularly scheduled once daily regimen the next day. Do not take two doses of Xarelto on the same day.   Important Safety Information A possible side effect of Xarelto is bleeding. You should call your healthcare provider right away if you experience any of the following: ? Bleeding from an injury or your nose that does not stop. ? Unusual colored urine (red or dark brown) or unusual colored stools (red or black). ? Unusual bruising for unknown reasons. ? A serious fall or if you hit your head (even if there is no bleeding).  Some medicines may interact with Xarelto and might increase your risk of bleeding while on Xarelto. To help avoid this, consult your healthcare provider or pharmacist prior to using any new prescription or non-prescription medications, including herbals, vitamins, non-steroidal anti-inflammatory drugs  (NSAIDs) and supplements.  This website has more information on Xarelto: [email or web address]

## 2016-06-11 NOTE — Progress Notes (Signed)
   Subjective: 1 Day Post-Op Procedure(s) (LRB): RIGHT TOTAL HIP ARTHROPLASTY ANTERIOR APPROACH (Right) Patient reports pain as mild.   Patient seen in rounds for Dr. Wynelle Link.  Patient's wife Bethena Roys in room and at bedside.  Questions and answers this morning about discharge.  Want to try and go home today. Patient is well, and has had no acute complaints or problems We will start therapy today.  If they do well with therapy and meets all goals, then will allow home later this afternoon following therapy. Plan is to go Home after hospital stay.  Objective: Vital signs in last 24 hours: Temp:  [97.5 F (36.4 C)-98.1 F (36.7 C)] 98.1 F (36.7 C) (10/31 0437) Pulse Rate:  [49-73] 58 (10/31 0437) Resp:  [9-20] 15 (10/31 0437) BP: (121-165)/(58-93) 124/58 (10/31 0437) SpO2:  [92 %-100 %] 98 % (10/31 0437) Weight:  [109.3 kg (241 lb)] 109.3 kg (241 lb) (10/30 1233)  Intake/Output from previous day:  Intake/Output Summary (Last 24 hours) at 06/11/16 0846 Last data filed at 06/11/16 0800  Gross per 24 hour  Intake          3086.67 ml  Output             3785 ml  Net          -698.33 ml    Intake/Output this shift: Total I/O In: 120 [P.O.:120] Out: -   Labs:  Recent Labs  06/11/16 0425  HGB 12.7*    Recent Labs  06/11/16 0425  WBC 7.6  RBC 3.71*  HCT 35.7*  PLT 152    Recent Labs  06/11/16 0425  NA 135  K 4.4  CL 103  CO2 25  BUN 13  CREATININE 0.83  GLUCOSE 164*  CALCIUM 8.7*   No results for input(s): LABPT, INR in the last 72 hours.  EXAM General - Patient is Alert, Appropriate and Oriented Extremity - Neurovascular intact Sensation intact distally Intact pulses distally Dorsiflexion/Plantar flexion intact Dressing - dressing C/D/I Motor Function - intact, moving foot and toes well on exam.  Hemovac pulled without difficulty.  Past Medical History:  Diagnosis Date  . Arthritis    Arthritis knees- rt. hip  . Hypertension    tends to elevate in  MD office.    Assessment/Plan: 1 Day Post-Op Procedure(s) (LRB): RIGHT TOTAL HIP ARTHROPLASTY ANTERIOR APPROACH (Right) Principal Problem:   OA (osteoarthritis) of hip  Estimated body mass index is 33.61 kg/m as calculated from the following:   Height as of this encounter: 5\' 11"  (1.803 m).   Weight as of this encounter: 109.3 kg (241 lb). Advance diet Up with therapy Discharge home with home health  DVT Prophylaxis - Xarelto Weight Bearing As Tolerated right Leg Hemovac Pulled Begin Therapy  If meets goals and able to go home: Discharge home with home health Diet - Cardiac diet Follow up - in 2 weeks Activity - WBAT Disposition - Home Condition Upon Discharge - improving D/C Meds - See DC Summary DVT Prophylaxis - Xarelto  Arlee Muslim, PA-C Orthopaedic Surgery 06/11/2016, 8:46 AM

## 2016-06-11 NOTE — Evaluation (Signed)
Physical Therapy Evaluation Patient Details Name: Christopher Gibbs. MRN: [id number] DOB: 15-Dec-1945 Today's Date: 06/11/2016   History of Present Illness  RIGHT TOTAL HIP ARTHROPLASTY ANTERIOR APPROACH (Right)  Clinical Impression  Pt is s/p THA resulting in the deficits listed below (see PT Problem List). * Pt will benefit from skilled PT to increase their independence and safety with mobility to allow discharge to the venue listed below.      Follow Up Recommendations Home health PT    Equipment Recommendations  Rolling walker with 5" wheels    Recommendations for Other Services OT consult     Precautions / Restrictions Precautions Precautions: None Restrictions Weight Bearing Restrictions: No RLE Weight Bearing: Weight bearing as tolerated      Mobility  Bed Mobility Overal bed mobility: Needs Assistance Bed Mobility: Supine to Sit     Supine to sit: Min assist     General bed mobility comments: OOB with OT  Transfers Overall transfer level: Needs assistance Equipment used: Rolling walker (2 wheeled) Transfers: Sit to/from Stand Sit to Stand: Min guard Stand pivot transfers: Min assist       General transfer comment: cues for hand placement  Ambulation/Gait Ambulation/Gait assistance: Min guard Ambulation Distance (Feet): 120 Feet Assistive device: Rolling walker (2 wheeled) Gait Pattern/deviations: Step-through pattern;Decreased weight shift to right     General Gait Details: cues for sequence and RW safety  Stairs            Wheelchair Mobility    Modified Rankin (Stroke Patients Only)       Balance                                             Pertinent Vitals/Pain Pain Assessment: 0-10 Pain Score: 2  Pain Descriptors / Indicators: Sore Pain Intervention(s): Limited activity within patient's tolerance;Monitored during session;Premedicated before session    Home Living Family/patient expects to be discharged  to:: Private residence Living Arrangements: Spouse/significant other Available Help at Discharge: Family Type of Home: House Home Access: Stairs to enter Entrance Stairs-Rails: Right   Home Layout: Two level Home Equipment: [place] - single point      Prior Function Level of Independence: Independent               Hand Dominance        Extremity/Trunk Assessment   Upper Extremity Assessment: Defer to OT evaluation;Overall WFL for tasks assessed           Lower Extremity Assessment: RLE deficits/detail RLE Deficits / Details: hip flexion 3/5; knee grossly WFL       Communication   Communication: No difficulties  Cognition Arousal/Alertness: Awake/alert Behavior During Therapy: WFL for tasks assessed/performed Overall Cognitive Status: Within Functional Limits for tasks assessed                      General Comments      Exercises Total Joint Exercises Ankle Circles/Pumps: AROM;Both;10 reps Quad Sets: 10 reps;AROM;Both   Assessment/Plan    PT Assessment Patient needs continued PT services  PT Problem List Decreased strength;Decreased range of motion;Decreased activity tolerance;Decreased balance;Decreased knowledge of use of DME;Decreased mobility;Pain          PT Treatment Interventions DME instruction;Gait training;Functional mobility training;Therapeutic activities;Therapeutic exercise;Patient/family education;Stair training    PT Goals (Current goals can be found in the Care Plan  section)  Acute Rehab PT Goals Patient Stated Goal: home PT Goal Formulation: With patient Time For Goal Achievement: 06/18/16 Potential to Achieve Goals: Good    Frequency 7X/week   Barriers to discharge        Co-evaluation               End of Session Equipment Utilized During Treatment: Gait belt Activity Tolerance: Patient tolerated treatment well Patient left: in chair;with call bell/phone within reach;with chair alarm set;with family/visitor  present Nurse Communication: Mobility status         Time: [id number] PT Time Calculation (min) (ACUTE ONLY): 18 min   Charges:   PT Evaluation $PT Eval Low Complexity: 1 Procedure     PT G Codes:        Christopher Gibbs 2016-06-18, 11:00 AM

## 2016-06-11 NOTE — Care Management Note (Signed)
Case Management Note  Patient Details  Name: Christopher Gibbs. MRN: [id number] Date of Birth: 18-Jul-1946  Subjective/Objective:                  RIGHT TOTAL HIP ARTHROPLASTY ANTERIOR APPROACH (Right) Action/Plan: Discharge planning Expected Discharge Date:  06/11/16               Expected Discharge Plan:  [place]  In-House Referral:     Discharge planning Services  CM Consult  Post Acute Care Choice:  Home Health Choice offered to:  Patient  DME Arranged:  3-N-1, Walker rolling, Hospital bed DME Agency:  [place]:  PT [place]:  [facility]  Status of Service:  Completed, signed off  If discussed at [employer] of Stay Meetings, dates discussed:    Additional Comments: Cm met with pt in room to offer choice of home health agency.  Pt chooses [facility] to render HHPT.  Referral called to [facility] rep, Stanton Kidney.  CM notified [place] rep, [name] pt is requesting 2 3n1s and has been counseled concerning insurance will only pay for one; pt is willing to pay out of pocket for additional 3n1.  Pt is also needing hospital bed and rolling walker.  Brenton Grills states he will arrange for delivery of bed and will deliver 3n1s and rolling walker to room.  No other CM needs were communicated. Dellie Catholic, RN 06/11/2016, 12:02 PM

## 2016-06-11 NOTE — Progress Notes (Signed)
   06/11/16 1300  PT Visit Information  Last PT Received On 06/11/16  Assistance Needed +1  History of Present Illness RIGHT TOTAL HIP ARTHROPLASTY ANTERIOR APPROACH (Right)  Subjective Data  Patient Stated Goal home  Precautions  Precautions None  Restrictions  Weight Bearing Restrictions No  RLE Weight Bearing WBAT  Pain Assessment  Pain Assessment 0-10  Pain Score 2  Pain Location right knee  Pain Descriptors / Indicators Discomfort  Pain Intervention(s) Limited activity within patient's tolerance;Monitored during session  Cognition  Arousal/Alertness Awake/alert  Behavior During Therapy WFL for tasks assessed/performed  Overall Cognitive Status Within Functional Limits for tasks assessed  Bed Mobility  General bed mobility comments in recliner  Transfers  Overall transfer level Needs assistance  Equipment used Rolling walker (2 wheeled)  Transfers Sit to/from Stand  Sit to Stand Supervision  General transfer comment cues for hand placement  Ambulation/Gait  Ambulation/Gait assistance Supervision  Ambulation Distance (Feet) 110 Feet  Assistive device Rolling walker (2 wheeled)  Gait Pattern/deviations Step-through pattern  General Gait Details cues for sequence and RW safety  Stairs Yes  Stairs assistance Min guard  Stair Management One rail Right;With cane;Step to pattern;Forwards  Number of Stairs 5  General stair comments cues for sequence  Total Joint Exercises  Ankle Circles/Pumps AROM;Both;10 reps  Quad Sets 10 reps;AROM;Both  Heel Slides AAROM;Right;10 reps  Hip ABduction/ADduction AROM;AAROM;Right;10 reps  Long [employer] AROM;Strengthening;Right;10 reps;Seated  PT - End of Session  Equipment Utilized During Treatment Gait belt  Activity Tolerance Patient tolerated treatment well  Patient left in chair;with call bell/phone within reach;with chair alarm set;with family/visitor present  Nurse Communication Mobility status  PT - Assessment/Plan  PT Plan  Current plan remains appropriate  PT Frequency (ACUTE ONLY) 7X/week  Follow Up Recommendations Home health PT  PT equipment Rolling walker with 5" wheels  PT Goal Progression  Progress towards PT goals Progressing toward goals  Acute Rehab PT Goals  PT Goal Formulation With patient  Time For Goal Achievement 06/18/16  Potential to Achieve Goals Good  PT Time Calculation  PT Start Time (ACUTE ONLY) 1323  PT Stop Time (ACUTE ONLY) 1343  PT Time Calculation (min) (ACUTE ONLY) 20 min  PT General Charges  $$ ACUTE PT VISIT 1 Procedure  PT Treatments  $Gait Training 8-22 mins

## 2016-06-12 DIAGNOSIS — Z6835 Body mass index (BMI) 35.0-35.9, adult: Secondary | ICD-10-CM | POA: Diagnosis not present

## 2016-06-12 DIAGNOSIS — I10 Essential (primary) hypertension: Secondary | ICD-10-CM | POA: Diagnosis not present

## 2016-06-12 DIAGNOSIS — Z96641 Presence of right artificial hip joint: Secondary | ICD-10-CM | POA: Diagnosis not present

## 2016-06-12 DIAGNOSIS — Z471 Aftercare following joint replacement surgery: Secondary | ICD-10-CM | POA: Diagnosis not present

## 2016-06-13 DIAGNOSIS — Z96641 Presence of right artificial hip joint: Secondary | ICD-10-CM | POA: Diagnosis not present

## 2016-06-13 DIAGNOSIS — Z471 Aftercare following joint replacement surgery: Secondary | ICD-10-CM | POA: Diagnosis not present

## 2016-06-13 DIAGNOSIS — I10 Essential (primary) hypertension: Secondary | ICD-10-CM | POA: Diagnosis not present

## 2016-06-13 DIAGNOSIS — Z6835 Body mass index (BMI) 35.0-35.9, adult: Secondary | ICD-10-CM | POA: Diagnosis not present

## 2016-06-18 DIAGNOSIS — I10 Essential (primary) hypertension: Secondary | ICD-10-CM | POA: Diagnosis not present

## 2016-06-18 DIAGNOSIS — Z6835 Body mass index (BMI) 35.0-35.9, adult: Secondary | ICD-10-CM | POA: Diagnosis not present

## 2016-06-18 DIAGNOSIS — Z471 Aftercare following joint replacement surgery: Secondary | ICD-10-CM | POA: Diagnosis not present

## 2016-06-18 DIAGNOSIS — Z96641 Presence of right artificial hip joint: Secondary | ICD-10-CM | POA: Diagnosis not present

## 2016-06-20 DIAGNOSIS — Z471 Aftercare following joint replacement surgery: Secondary | ICD-10-CM | POA: Diagnosis not present

## 2016-06-20 DIAGNOSIS — I10 Essential (primary) hypertension: Secondary | ICD-10-CM | POA: Diagnosis not present

## 2016-06-20 DIAGNOSIS — Z6835 Body mass index (BMI) 35.0-35.9, adult: Secondary | ICD-10-CM | POA: Diagnosis not present

## 2016-06-20 DIAGNOSIS — Z96641 Presence of right artificial hip joint: Secondary | ICD-10-CM | POA: Diagnosis not present

## 2016-06-21 DIAGNOSIS — Z6835 Body mass index (BMI) 35.0-35.9, adult: Secondary | ICD-10-CM | POA: Diagnosis not present

## 2016-06-21 DIAGNOSIS — I10 Essential (primary) hypertension: Secondary | ICD-10-CM | POA: Diagnosis not present

## 2016-06-21 DIAGNOSIS — Z471 Aftercare following joint replacement surgery: Secondary | ICD-10-CM | POA: Diagnosis not present

## 2016-06-21 DIAGNOSIS — Z96641 Presence of right artificial hip joint: Secondary | ICD-10-CM | POA: Diagnosis not present

## 2016-06-25 DIAGNOSIS — Z471 Aftercare following joint replacement surgery: Secondary | ICD-10-CM | POA: Diagnosis not present

## 2016-06-25 DIAGNOSIS — Z96641 Presence of right artificial hip joint: Secondary | ICD-10-CM | POA: Diagnosis not present

## 2016-07-30 DIAGNOSIS — Z471 Aftercare following joint replacement surgery: Secondary | ICD-10-CM | POA: Diagnosis not present

## 2016-07-30 DIAGNOSIS — Z96641 Presence of right artificial hip joint: Secondary | ICD-10-CM | POA: Diagnosis not present

## 2016-11-11 DIAGNOSIS — E669 Obesity, unspecified: Secondary | ICD-10-CM | POA: Diagnosis not present

## 2016-11-11 DIAGNOSIS — Z0000 Encounter for general adult medical examination without abnormal findings: Secondary | ICD-10-CM | POA: Diagnosis not present

## 2016-11-11 DIAGNOSIS — Z125 Encounter for screening for malignant neoplasm of prostate: Secondary | ICD-10-CM | POA: Diagnosis not present

## 2016-11-11 DIAGNOSIS — Z79899 Other long term (current) drug therapy: Secondary | ICD-10-CM | POA: Diagnosis not present

## 2016-11-11 DIAGNOSIS — I10 Essential (primary) hypertension: Secondary | ICD-10-CM | POA: Diagnosis not present

## 2016-11-11 DIAGNOSIS — Z23 Encounter for immunization: Secondary | ICD-10-CM | POA: Diagnosis not present

## 2016-11-11 DIAGNOSIS — Z136 Encounter for screening for cardiovascular disorders: Secondary | ICD-10-CM | POA: Diagnosis not present

## 2017-01-09 DIAGNOSIS — L812 Freckles: Secondary | ICD-10-CM | POA: Diagnosis not present

## 2017-01-09 DIAGNOSIS — L570 Actinic keratosis: Secondary | ICD-10-CM | POA: Diagnosis not present

## 2017-01-09 DIAGNOSIS — D1801 Hemangioma of skin and subcutaneous tissue: Secondary | ICD-10-CM | POA: Diagnosis not present

## 2017-01-09 DIAGNOSIS — L821 Other seborrheic keratosis: Secondary | ICD-10-CM | POA: Diagnosis not present

## 2017-01-09 DIAGNOSIS — Z85828 Personal history of other malignant neoplasm of skin: Secondary | ICD-10-CM | POA: Diagnosis not present

## 2017-03-12 DIAGNOSIS — E669 Obesity, unspecified: Secondary | ICD-10-CM | POA: Diagnosis not present

## 2017-03-12 DIAGNOSIS — I10 Essential (primary) hypertension: Secondary | ICD-10-CM | POA: Diagnosis not present

## 2017-03-12 DIAGNOSIS — E7800 Pure hypercholesterolemia, unspecified: Secondary | ICD-10-CM | POA: Diagnosis not present

## 2017-05-08 DIAGNOSIS — Z23 Encounter for immunization: Secondary | ICD-10-CM | POA: Diagnosis not present

## 2017-06-23 DIAGNOSIS — E7800 Pure hypercholesterolemia, unspecified: Secondary | ICD-10-CM | POA: Diagnosis not present

## 2018-01-12 DIAGNOSIS — Z85828 Personal history of other malignant neoplasm of skin: Secondary | ICD-10-CM | POA: Diagnosis not present

## 2018-01-12 DIAGNOSIS — B351 Tinea unguium: Secondary | ICD-10-CM | POA: Diagnosis not present

## 2018-01-12 DIAGNOSIS — L812 Freckles: Secondary | ICD-10-CM | POA: Diagnosis not present

## 2018-01-12 DIAGNOSIS — L570 Actinic keratosis: Secondary | ICD-10-CM | POA: Diagnosis not present

## 2018-01-12 DIAGNOSIS — L821 Other seborrheic keratosis: Secondary | ICD-10-CM | POA: Diagnosis not present

## 2018-01-12 DIAGNOSIS — D1801 Hemangioma of skin and subcutaneous tissue: Secondary | ICD-10-CM | POA: Diagnosis not present

## 2018-02-20 DIAGNOSIS — Z471 Aftercare following joint replacement surgery: Secondary | ICD-10-CM | POA: Diagnosis not present

## 2018-02-20 DIAGNOSIS — M25561 Pain in right knee: Secondary | ICD-10-CM | POA: Diagnosis not present

## 2018-02-20 DIAGNOSIS — M1611 Unilateral primary osteoarthritis, right hip: Secondary | ICD-10-CM | POA: Diagnosis not present

## 2018-02-20 DIAGNOSIS — Z96641 Presence of right artificial hip joint: Secondary | ICD-10-CM | POA: Diagnosis not present

## 2018-02-20 DIAGNOSIS — M25562 Pain in left knee: Secondary | ICD-10-CM | POA: Diagnosis not present

## 2018-07-02 DIAGNOSIS — M1711 Unilateral primary osteoarthritis, right knee: Secondary | ICD-10-CM | POA: Diagnosis not present

## 2018-07-02 DIAGNOSIS — M1712 Unilateral primary osteoarthritis, left knee: Secondary | ICD-10-CM | POA: Diagnosis not present

## 2018-08-14 DIAGNOSIS — M25561 Pain in right knee: Secondary | ICD-10-CM | POA: Diagnosis not present

## 2018-08-14 DIAGNOSIS — M25562 Pain in left knee: Secondary | ICD-10-CM | POA: Diagnosis not present

## 2018-08-16 ENCOUNTER — Encounter: Payer: Self-pay | Admitting: Gastroenterology

## 2018-10-20 DIAGNOSIS — I10 Essential (primary) hypertension: Secondary | ICD-10-CM | POA: Diagnosis not present

## 2018-10-20 DIAGNOSIS — Z79899 Other long term (current) drug therapy: Secondary | ICD-10-CM | POA: Diagnosis not present

## 2018-10-20 DIAGNOSIS — R7301 Impaired fasting glucose: Secondary | ICD-10-CM | POA: Diagnosis not present

## 2018-10-20 DIAGNOSIS — Z125 Encounter for screening for malignant neoplasm of prostate: Secondary | ICD-10-CM | POA: Diagnosis not present

## 2018-10-20 DIAGNOSIS — E7800 Pure hypercholesterolemia, unspecified: Secondary | ICD-10-CM | POA: Diagnosis not present

## 2018-10-20 DIAGNOSIS — Z23 Encounter for immunization: Secondary | ICD-10-CM | POA: Diagnosis not present

## 2018-10-20 DIAGNOSIS — Z0001 Encounter for general adult medical examination with abnormal findings: Secondary | ICD-10-CM | POA: Diagnosis not present

## 2018-10-20 DIAGNOSIS — Z6835 Body mass index (BMI) 35.0-35.9, adult: Secondary | ICD-10-CM | POA: Diagnosis not present

## 2018-10-20 DIAGNOSIS — E669 Obesity, unspecified: Secondary | ICD-10-CM | POA: Diagnosis not present

## 2018-11-24 DIAGNOSIS — M1712 Unilateral primary osteoarthritis, left knee: Secondary | ICD-10-CM | POA: Diagnosis not present

## 2018-11-24 DIAGNOSIS — M170 Bilateral primary osteoarthritis of knee: Secondary | ICD-10-CM | POA: Diagnosis not present

## 2018-11-24 DIAGNOSIS — M25561 Pain in right knee: Secondary | ICD-10-CM | POA: Diagnosis not present

## 2018-11-24 DIAGNOSIS — M1711 Unilateral primary osteoarthritis, right knee: Secondary | ICD-10-CM | POA: Diagnosis not present

## 2018-11-24 DIAGNOSIS — M25562 Pain in left knee: Secondary | ICD-10-CM | POA: Diagnosis not present

## 2019-01-12 ENCOUNTER — Ambulatory Visit
Admission: RE | Admit: 2019-01-12 | Discharge: 2019-01-12 | Disposition: A | Discharge status: Home / Self Care | Payer: Medicare Other | Source: Ambulatory Visit | Attending: Family Medicine | Admitting: Family Medicine

## 2019-01-12 ENCOUNTER — Other Ambulatory Visit: Payer: Self-pay | Admitting: Family Medicine

## 2019-01-12 DIAGNOSIS — I10 Essential (primary) hypertension: Secondary | ICD-10-CM | POA: Diagnosis not present

## 2019-01-12 DIAGNOSIS — Z79899 Other long term (current) drug therapy: Secondary | ICD-10-CM

## 2019-01-12 DIAGNOSIS — Z01818 Encounter for other preprocedural examination: Secondary | ICD-10-CM | POA: Diagnosis not present

## 2019-01-13 DIAGNOSIS — M1712 Unilateral primary osteoarthritis, left knee: Secondary | ICD-10-CM | POA: Diagnosis not present

## 2019-01-18 DIAGNOSIS — D1801 Hemangioma of skin and subcutaneous tissue: Secondary | ICD-10-CM | POA: Diagnosis not present

## 2019-01-18 DIAGNOSIS — C44719 Basal cell carcinoma of skin of left lower limb, including hip: Secondary | ICD-10-CM | POA: Diagnosis not present

## 2019-01-18 DIAGNOSIS — L821 Other seborrheic keratosis: Secondary | ICD-10-CM | POA: Diagnosis not present

## 2019-01-18 DIAGNOSIS — L570 Actinic keratosis: Secondary | ICD-10-CM | POA: Diagnosis not present

## 2019-01-18 DIAGNOSIS — Z85828 Personal history of other malignant neoplasm of skin: Secondary | ICD-10-CM | POA: Diagnosis not present

## 2019-01-18 DIAGNOSIS — L812 Freckles: Secondary | ICD-10-CM | POA: Diagnosis not present

## 2019-01-22 NOTE — H&P (Signed)
TOTAL KNEE ADMISSION H&P  Patient is being admitted for left total knee arthroplasty.  Subjective:  Chief Complaint:left knee pain.  HPI: Christopher Bramel., 73 y.o. male, has a history of pain and functional disability in the left knee due to arthritis and has failed non-surgical conservative treatments for greater than 12 weeks to includecorticosteriod injections and activity modification.  Onset of symptoms was gradual, starting 3 years ago with gradually worsening course since that time. The patient noted prior procedures on the knee to include  arthroscopy on the left knee(s).  Patient currently rates pain in the left knee(s) at 7 out of 10 with activity. Patient has worsening of pain with activity and weight bearing and crepitus.  Patient has evidence of bone-on-bone osteoarthritis within all 3 compartments of the left knee with tibial subluxation and varus deformity by imaging studies.There is no active infection.  Patient Active Problem List   Diagnosis Date Noted   OA (osteoarthritis) of hip 06/10/2016   Pain in right hip 10/26/2015   Past Medical History:  Diagnosis Date   Arthritis    Arthritis knees- rt. hip   Hypertension    tends to elevate in MD office.    Past Surgical History:  Procedure Laterality Date   KNEE ARTHROSCOPY     bilat   KNEE ARTHROSCOPY     bilateral -knees   TOTAL HIP ARTHROPLASTY Right 06/10/2016   Procedure: RIGHT TOTAL HIP ARTHROPLASTY ANTERIOR APPROACH;  Surgeon: Gaynelle Arabian, MD;  Location: [facility];  Service: Orthopedics;  Laterality: Right;    No current facility-administered medications for this encounter.    Current Outpatient Medications  Medication Sig Dispense Refill Last Dose   amLODipine (NORVASC) 2.5 MG tablet Take 2.5 mg by mouth daily.   06/10/2016 at 0800   methocarbamol (ROBAXIN) 500 MG tablet Take 1 tablet (500 mg total) by mouth every 6 (six) hours as needed for muscle spasms. 90 tablet 0    oxyCODONE (OXY  IR/ROXICODONE) 5 MG immediate release tablet Take 1-2 tablets (5-10 mg total) by mouth every 3 (three) hours as needed for moderate pain or severe pain. 80 tablet 0    rivaroxaban (XARELTO) 10 MG TABS tablet Take 1 tablet (10 mg total) by mouth daily with breakfast. Take Xarelto for two and a half more weeks, then discontinue Xarelto. Once the patient has completed the blood thinner regimen, then take a Baby 81 mg Aspirin daily for three more weeks. 20 tablet 0    traMADol (ULTRAM) 50 MG tablet Take 1-2 tablets (50-100 mg total) by mouth every 6 (six) hours as needed (mild pain). 80 tablet 1    No Known Allergies  Social History   Tobacco Use   Smoking status: Never Smoker   Smokeless tobacco: Never Used  Substance Use Topics   Alcohol use: Yes    Alcohol/week: 8.0 standard drinks    Types: 8 Cans of beer per week    Family History  Problem Relation Age of Onset   Colon cancer Neg Hx    Pancreatic cancer Neg Hx    Stomach cancer Neg Hx    Esophageal cancer Neg Hx      ROS Constitutional: Constitutional: no fever, chills, night sweats, or significant weight loss. Cardiovascular: Cardiovascular: no palpitations or chest pain. Respiratory: Respiratory: no cough or shortness of breath and No COPD. Gastrointestinal: Gastrointestinal: no vomiting or nausea. Musculoskeletal: Musculoskeletal: no swelling in Joints and Joint Pain. Neurologic: Neurologic: no numbness, tingling, or difficulty with balance.  Objective:  Physical Exam Patient is a 73 year old male.  Well nourished and well developed. General: Alert and oriented x3, cooperative and pleasant, no acute distress. Head: normocephalic, atraumatic, neck supple. Eyes: EOMI. Respiratory: breath sounds clear in all fields, no wheezing, rales, or rhonchi. Cardiovascular: Regular rate and rhythm, no murmurs, gallops or rubs. Abdomen: non-tender to palpation and soft, normoactive bowel sounds.  Musculoskeletal: Right  Knee Exam: Neutral alignment. No effusion. No Swelling. Range of motion is 5-125 degrees. No crepitus on range of motion of the knee. Positive medial more than lateral joint line tenderness. Stable knee.  Left Knee Exam: Varus deformity. No effusion. No Swelling. Range of motion is 10-120 degrees. Moderate crepitus on range of motion of the knee. Positive medial joint line tenderness No lateral joint line tenderness. Stable knee.  Calves soft and nontender. Motor function intact in LE. Strength 5/5 LE bilaterally. Neuro: Distal pulses 2+. Sensation to light touch intact in LE. Vital signs in last 24 hours: BP: 152/100 mmHg Pulse: 72 bpm  Labs:   Estimated body mass index is 33.61 kg/m as calculated from the following:   Height as of 06/10/16: 5\' 11"  (1.803 m).   Weight as of 06/10/16: 109.3 kg.   Imaging Review Plain radiographs demonstrate severe degenerative joint disease of the left knee(s). The overall alignment ismild varus. The bone quality appears to be adequate for age and reported activity level.      Assessment/Plan:  End stage arthritis, left knee   The patient history, physical examination, clinical judgment of the provider and imaging studies are consistent with end stage degenerative joint disease of the left knee(s) and total knee arthroplasty is deemed medically necessary. The treatment options including medical management, injection therapy arthroscopy and arthroplasty were discussed at length. The risks and benefits of total knee arthroplasty were presented and reviewed. The risks due to aseptic loosening, infection, stiffness, patella tracking problems, thromboembolic complications and other imponderables were discussed. The patient acknowledged the explanation, agreed to proceed with the plan and consent was signed. Patient is being admitted for inpatient treatment for surgery, pain control, PT, OT, prophylactic antibiotics, VTE prophylaxis, progressive  ambulation and ADL's and discharge planning. The patient is planning to be discharged home.  Therapy Plans: outpatient therapy at [facility] on 7/2 Disposition: Home with wife Planned DVT Prophylaxis: aspirin 325mg  BID DME needed: none PCP: Dr. Dorthy Cooler TXA: IV Allergies: NKDA Anesthesia Concerns: none BMI: 33.5  Other: none  Patient's anticipated LOS is less than 2 midnights, meeting these requirements: - Lives within 1 hour of care - Has a competent adult at home to recover with post-op recover - NO history of  - Chronic pain requiring opiods  - Diabetes  - Coronary Artery Disease  - Heart failure  - Heart attack  - Stroke  - DVT/VTE  - Cardiac arrhythmia  - Respiratory Failure/COPD  - Renal failure  - Anemia  - Advanced Liver disease  - Patient was instructed on what medications to stop prior to surgery. - Follow-up visit in 2 weeks with Dr. Wynelle Link - Begin physical therapy following surgery - Pre-operative lab work as pre-surgical testing - Prescriptions will be provided in hospital at time of discharge  Griffith Citron, PA-C Orthopedic Surgery [facility] [place]

## 2019-01-29 NOTE — Progress Notes (Signed)
CHEST XRAY 01-12-19 EPIC

## 2019-01-29 NOTE — Patient Instructions (Addendum)
Christopher Gibbs.     Your procedure is scheduled on: 02-08-2019  Report to [facility] Main  Entrance Report to admitting at 11:50 AM   YOU NEED TO HAVE A COVID 19 TEST ON_6/25______ @_9 :40______, THIS TEST MUST BE DONE BEFORE SURGERY, COME TO [place]. ONCE YOUR COVID TEST IS COMPLETED, PLEASE BEGIN THE QUARANTINE INSTRUCTIONS AS OUTLINED IN YOUR HANDOUT.   Call this number if you have problems the morning of surgery [phone]    Remember. BRUSH YOUR TEETH MORNING OF SURGERY AND RINSE YOUR MOUTH OUT, NO CHEWING GUM CANDY OR MINTS.   NO SOLID FOOD AFTER MIDNIGHT THE NIGHT PRIOR TO SURGERY . NOTHING BY MOUTH EXCEPT CLEAR LIQUIDS UNTIL 11:20 AM.  PLEASE FINISH ENSURE DRINK PER SURGEON ORDER 3 HOURS PRIOR TO SCHEDULED SURGERY TIME WHICH NEEDS TO BE COMPLETED AT 11:20 AM.    CLEAR LIQUID DIET   Foods Allowed                                                                     Foods Excluded  Coffee and tea, regular and decaf                             liquids that you cannot  Plain Jell-O in any flavor                                             see through such as: Fruit ices (not with fruit pulp)                                     milk, soups, orange juice  Iced Popsicles                                    All solid food Carbonated beverages, regular and diet                                    Cranberry, grape and apple juices Sports drinks like Gatorade Lightly seasoned clear broth or consume(fat free) Sugar, honey syrup     Take these medicines the morning of surgery with A SIP OF WATER: ATORVASTATIN (LIPITOR), AMLODIPINE (NORVASC)                               You may not have any metal on your body including piercings          Do not wear jewelry,, lotions, powders or , deodorant                          Men may shave face and neck.   Do not bring  valuables to the hospital. [place].  Contacts, dentures or bridgework may not be worn into surgery.                    [place] - Preparing for Surgery  Before surgery, you can play an important role.   Because skin is not sterile, your skin needs to be as free of germs as possible.   You can reduce the number of germs on your skin by washing with CHG (chlorahexidine gluconate) soap before surgery.   CHG is an antiseptic cleaner which kills germs and bonds with the skin to continue killing germs even after washing. Please DO NOT use if you have an allergy to CHG or antibacterial soaps.   If your skin becomes reddened/irritated stop using the CHG and inform your nurse when you arrive at Short Stay. Do not shave (including legs and underarms) for at least 48 hours prior to the first CHG shower.  You may shave your face/neck. Please follow these instructions carefully:   1.  Shower with CHG Soap the night before surgery and the  morning of Surgery.  2.  If you choose to wash your hair, wash your hair first as usual with your  normal  shampoo.  3.  After you shampoo, rinse your hair and body thoroughly to remove the  shampoo.                                        4.  Use CHG as you would any other liquid soap.  You can apply chg directly  to the skin and wash                       Gently with a scrungie or clean washcloth.  5.  Apply the CHG Soap to your body ONLY FROM THE NECK DOWN.   Do not use on face/ open                           Wound or open sores. Avoid contact with eyes, ears mouth and genitals (private parts).                       Wash face,  Genitals (private parts) with your normal soap.             6.  Wash thoroughly, paying special attention to the area where your surgery  will be performed.  7.  Thoroughly rinse your body with warm water from the neck down.  8.  DO NOT shower/wash with your normal soap after using and rinsing off  the CHG Soap.             9.  Pat yourself  dry with a clean towel.            10.  Wear clean pajamas.            11.  Place clean sheets on your bed the night of your first shower and do not  sleep with pets.  Day of Surgery : Do not apply any lotions/deodorants the morning of surgery.  Please wear clean clothes to the hospital/surgery center.   FAILURE TO FOLLOW THESE  INSTRUCTIONS MAY RESULT IN THE CANCELLATION OF YOUR SURGERY PATIENT SIGNATURE_________________________________  NURSE SIGNATURE__________________________________  ________________________________________________________________________   Christopher Gibbs  An incentive spirometer is a tool that can help keep your lungs clear and active. This tool measures how well you are filling your lungs with each breath. Taking long deep breaths may help reverse or decrease the chance of developing breathing (pulmonary) problems (especially infection) following:  A long period of time when you are unable to move or be active. BEFORE THE PROCEDURE   If the spirometer includes an indicator to show your best effort, your nurse or respiratory therapist will set it to a desired goal.  If possible, sit up straight or lean slightly forward. Try not to slouch.  Hold the incentive spirometer in an upright position. INSTRUCTIONS FOR USE  1. Sit on the edge of your bed if possible, or sit up as far as you can in bed or on a chair. 2. Hold the incentive spirometer in an upright position. 3. Breathe out normally. 4. Place the mouthpiece in your mouth and seal your lips tightly around it. 5. Breathe in slowly and as deeply as possible, raising the piston or the ball toward the top of the column. 6. Hold your breath for 3-5 seconds or for as long as possible. Allow the piston or ball to fall to the bottom of the column. 7. Remove the mouthpiece from your mouth and breathe out normally. 8. Rest for a few seconds and repeat Steps 1 through 7 at least 10 times every 1-2 hours when you  are awake. Take your time and take a few normal breaths between deep breaths. 9. The spirometer may include an indicator to show your best effort. Use the indicator as a goal to work toward during each repetition. 10. After each set of 10 deep breaths, practice coughing to be sure your lungs are clear. If you have an incision (the cut made at the time of surgery), support your incision when coughing by placing a pillow or rolled up towels firmly against it. Once you are able to get out of bed, walk around indoors and cough well. You may stop using the incentive spirometer when instructed by your caregiver.  RISKS AND COMPLICATIONS  Take your time so you do not get dizzy or light-headed.  If you are in pain, you may need to take or ask for pain medication before doing incentive spirometry. It is harder to take a deep breath if you are having pain. AFTER USE  Rest and breathe slowly and easily.  It can be helpful to keep track of a log of your progress. Your caregiver can provide you with a simple table to help with this. If you are using the spirometer at home, follow these instructions: [place] IF:   You are having difficultly using the spirometer.  You have trouble using the spirometer as often as instructed.  Your pain medication is not giving enough relief while using the spirometer.  You develop fever of 100.5 F (38.1 C) or higher. SEEK IMMEDIATE MEDICAL CARE IF:   You cough up bloody sputum that had not been present before.  You develop fever of 102 F (38.9 C) or greater.  You develop worsening pain at or near the incision site. MAKE SURE YOU:   Understand these instructions.  Will watch your condition.  Will get help right away if you are not doing well or get worse. Document Released: 12/09/2006 Document Revised: 10/21/2011 Document Reviewed:  02/09/2007 ExitCare Patient Information 2014 ExitCare,  [place].   ________________________________________________________________________  WHAT IS A BLOOD TRANSFUSION? Blood Transfusion Information  A transfusion is the replacement of blood or some of its parts. Blood is made up of multiple cells which provide different functions.  Gibbs blood cells carry oxygen and are used for blood loss replacement.  [place] blood cells fight against infection.  Platelets control bleeding.  Plasma helps clot blood.  Other blood products are available for specialized needs, such as hemophilia or other clotting disorders. BEFORE THE TRANSFUSION  Who gives blood for transfusions?   Healthy volunteers who are fully evaluated to make sure their blood is safe. This is blood bank blood. Transfusion therapy is the safest it has ever been in the practice of medicine. Before blood is taken from a donor, a complete history is taken to make sure that person has no history of diseases nor engages in risky social behavior (examples are intravenous drug use or sexual activity with multiple partners). The donor's travel history is screened to minimize risk of transmitting infections, such as malaria. The donated blood is tested for signs of infectious diseases, such as HIV and hepatitis. The blood is then tested to be sure it is compatible with you in order to minimize the chance of a transfusion reaction. If you or a relative donates blood, this is often done in anticipation of surgery and is not appropriate for emergency situations. It takes many days to process the donated blood. RISKS AND COMPLICATIONS Although transfusion therapy is very safe and saves many lives, the main dangers of transfusion include:   Getting an infectious disease.  Developing a transfusion reaction. This is an allergic reaction to something in the blood you were given. Every precaution is taken to prevent this. The decision to have a blood transfusion has been considered carefully by your caregiver  before blood is given. Blood is not given unless the benefits outweigh the risks. AFTER THE TRANSFUSION  Right after receiving a blood transfusion, you will usually feel much better and more energetic. This is especially true if your Gibbs blood cells have gotten low (anemic). The transfusion raises the level of the Gibbs blood cells which carry oxygen, and this usually causes an energy increase.  The nurse administering the transfusion will monitor you carefully for complications. HOME CARE INSTRUCTIONS  No special instructions are needed after a transfusion. You may find your energy is better. Speak with your caregiver about any limitations on activity for underlying diseases you may have. SEEK MEDICAL CARE IF:   Your condition is not improving after your transfusion.  You develop redness or irritation at the intravenous (IV) site. SEEK IMMEDIATE MEDICAL CARE IF:  Any of the following symptoms occur over the next 12 hours:  Shaking chills.  You have a temperature by mouth above 102 F (38.9 C), not controlled by medicine.  Chest, back, or muscle pain.  People around you feel you are not acting correctly or are confused.  Shortness of breath or difficulty breathing.  Dizziness and fainting.  You get a rash or develop hives.  You have a decrease in urine output.  Your urine turns a dark color or changes to pink, Gibbs, or brown. Any of the following symptoms occur over the next 10 days:  You have a temperature by mouth above 102 F (38.9 C), not controlled by medicine.  Shortness of breath.  Weakness after normal activity.  The [place] part of the eye turns yellow (jaundice).  You have a decrease in the amount of urine or are urinating less often.  Your urine turns a dark color or changes to pink, Gibbs, or brown. Document Released: 07/26/2000 Document Revised: 10/21/2011 Document Reviewed: 03/14/2008 [facility] Patient Information 2014 [place],  [place].  _______________________________________________________________________

## 2019-02-01 ENCOUNTER — Encounter (HOSPITAL_COMMUNITY): Payer: Self-pay

## 2019-02-01 ENCOUNTER — Other Ambulatory Visit: Payer: Self-pay

## 2019-02-01 ENCOUNTER — Encounter (HOSPITAL_COMMUNITY)
Admission: RE | Admit: 2019-02-01 | Discharge: 2019-02-01 | Disposition: A | Discharge status: Home / Self Care | Payer: Medicare Other | Source: Ambulatory Visit | Attending: Orthopedic Surgery | Admitting: Orthopedic Surgery

## 2019-02-01 DIAGNOSIS — Z1159 Encounter for screening for other viral diseases: Secondary | ICD-10-CM | POA: Diagnosis not present

## 2019-02-01 DIAGNOSIS — M1712 Unilateral primary osteoarthritis, left knee: Secondary | ICD-10-CM | POA: Insufficient documentation

## 2019-02-01 DIAGNOSIS — Z01812 Encounter for preprocedural laboratory examination: Secondary | ICD-10-CM | POA: Diagnosis not present

## 2019-02-01 LAB — COMPREHENSIVE METABOLIC PANEL
ALT: 28 U/L (ref 0–44)
AST: 24 U/L (ref 15–41)
Albumin: 4.5 g/dL (ref 3.5–5.0)
Alkaline Phosphatase: 62 U/L (ref 38–126)
Anion gap: 8 (ref 5–15)
BUN: 13 mg/dL (ref 8–23)
CO2: 27 mmol/L (ref 22–32)
Calcium: 9.1 mg/dL (ref 8.9–10.3)
Chloride: 106 mmol/L (ref 98–111)
Creatinine, Ser: 0.89 mg/dL (ref 0.61–1.24)
GFR calc Af Amer: >60 mL/min (ref >60)
GFR calc non Af Amer: >60 mL/min (ref >60)
Glucose, Bld: 105 mg/dL — ABNORMAL HIGH (ref 70–99)
Potassium: 4.5 mmol/L (ref 3.5–5.1)
Sodium: 141 mmol/L (ref 135–145)
Total Bilirubin: 1.2 mg/dL (ref 0.3–1.2)
Total Protein: 7.3 g/dL (ref 6.5–8.1)

## 2019-02-01 LAB — CBC
HCT: 40.7 % (ref 39.0–52.0)
Hemoglobin: 14.2 g/dL (ref 13.0–17.0)
MCH: 35.1 pg — ABNORMAL HIGH (ref 26.0–34.0)
MCHC: 34.9 g/dL (ref 30.0–36.0)
MCV: 100.5 fL — ABNORMAL HIGH (ref 80.0–100.0)
Platelets: 143 10*3/uL — ABNORMAL LOW (ref 150–400)
RBC: 4.05 MIL/uL — ABNORMAL LOW (ref 4.22–5.81)
RDW: 12.9 % (ref 11.5–15.5)
WBC: 5.6 10*3/uL (ref 4.0–10.5)
nRBC: 0 % (ref 0.0–0.2)

## 2019-02-01 LAB — APTT: aPTT: 32 seconds (ref 24–36)

## 2019-02-01 LAB — PROTIME-INR
INR: 0.9 (ref 0.8–1.2)
Prothrombin Time: 12.5 seconds (ref 11.4–15.2)

## 2019-02-02 LAB — SURGICAL PCR SCREEN
MRSA, PCR: NEGATIVE
Staphylococcus aureus: POSITIVE — AB

## 2019-02-04 ENCOUNTER — Other Ambulatory Visit (HOSPITAL_COMMUNITY)
Admission: RE | Admit: 2019-02-04 | Discharge: 2019-02-04 | Disposition: A | Discharge status: Home / Self Care | Payer: Medicare Other | Source: Ambulatory Visit | Attending: Orthopedic Surgery | Admitting: Orthopedic Surgery

## 2019-02-04 DIAGNOSIS — M1712 Unilateral primary osteoarthritis, left knee: Secondary | ICD-10-CM | POA: Diagnosis not present

## 2019-02-04 DIAGNOSIS — Z1159 Encounter for screening for other viral diseases: Secondary | ICD-10-CM | POA: Diagnosis not present

## 2019-02-04 DIAGNOSIS — Z01812 Encounter for preprocedural laboratory examination: Secondary | ICD-10-CM | POA: Diagnosis not present

## 2019-02-04 LAB — SARS CORONAVIRUS 2 (TAT 6-24 HRS): SARS Coronavirus 2: NEGATIVE

## 2019-02-05 NOTE — Progress Notes (Signed)
Christopher Gibbs aware to arrive at 1115AM 02/08/2019.

## 2019-02-07 MED ORDER — BUPIVACAINE LIPOSOME 1.3 % IJ SUSP
20.0000 mL | INTRAMUSCULAR | Status: AC
  Filled 2019-02-07: qty 20

## 2019-02-07 NOTE — Anesthesia Preprocedure Evaluation (Addendum)
Anesthesia Evaluation  Patient identified by MRN, date of birth, ID band Patient awake    Reviewed: Allergy & Precautions, NPO status , Patient's Chart, lab work & pertinent test results  Airway Mallampati: II  TM Distance: >3 FB Neck ROM: Full    Dental no notable dental hx. (+) Teeth Intact   Pulmonary neg pulmonary ROS,    Pulmonary exam normal breath sounds clear to auscultation       Cardiovascular hypertension, Pt. on medications Normal cardiovascular exam Rhythm:Regular Rate:Normal     Neuro/Psych negative neurological ROS  negative psych ROS   GI/Hepatic negative GI ROS, Neg liver ROS,   Endo/Other  negative endocrine ROS  Renal/GU negative Renal ROS     Musculoskeletal  (+) Arthritis ,   Abdominal   Peds  Hematology negative hematology ROS (+)   Anesthesia Other Findings   Reproductive/Obstetrics                            Anesthesia Physical Anesthesia Plan  ASA: II  Anesthesia Plan: Spinal   Post-op Pain Management:  [facility] for Post-op pain   Induction:   PONV Risk Score and Plan: 2 and Ondansetron and Treatment may vary due to age or medical condition  Airway Management Planned: Natural Airway and Nasal Cannula  Additional Equipment:   Intra-op Plan:   Post-operative Plan:   Informed Consent: I have reviewed the patients History and Physical, chart, labs and discussed the procedure including the risks, benefits and alternatives for the proposed anesthesia with the patient or authorized representative who has indicated his/her understanding and acceptance.       Plan Discussed with:   Anesthesia Plan Comments: ( Spinal w L Adductor canal block)      Anesthesia Quick Evaluation

## 2019-02-08 ENCOUNTER — Inpatient Hospital Stay (HOSPITAL_COMMUNITY)
Admission: RE | Admit: 2019-02-08 | Discharge: 2019-02-09 | DRG: 470 | Disposition: A | Discharge status: Home / Self Care | Payer: Medicare Other | Attending: Orthopedic Surgery | Admitting: Orthopedic Surgery

## 2019-02-08 ENCOUNTER — Other Ambulatory Visit: Payer: Self-pay

## 2019-02-08 ENCOUNTER — Encounter (HOSPITAL_COMMUNITY)
Admission: RE | Disposition: A | Discharge status: Home / Self Care | Payer: Self-pay | Source: Home / Self Care | Attending: Orthopedic Surgery

## 2019-02-08 ENCOUNTER — Inpatient Hospital Stay (HOSPITAL_COMMUNITY): Payer: Medicare Other | Admitting: Anesthesiology

## 2019-02-08 ENCOUNTER — Inpatient Hospital Stay (HOSPITAL_COMMUNITY): Payer: Medicare Other | Admitting: Physician Assistant

## 2019-02-08 ENCOUNTER — Encounter (HOSPITAL_COMMUNITY): Payer: Self-pay | Admitting: Emergency Medicine

## 2019-02-08 DIAGNOSIS — M179 Osteoarthritis of knee, unspecified: Secondary | ICD-10-CM | POA: Diagnosis present

## 2019-02-08 DIAGNOSIS — M1710 Unilateral primary osteoarthritis, unspecified knee: Secondary | ICD-10-CM | POA: Diagnosis present

## 2019-02-08 DIAGNOSIS — I10 Essential (primary) hypertension: Secondary | ICD-10-CM | POA: Diagnosis not present

## 2019-02-08 DIAGNOSIS — Z6833 Body mass index (BMI) 33.0-33.9, adult: Secondary | ICD-10-CM | POA: Diagnosis not present

## 2019-02-08 DIAGNOSIS — E669 Obesity, unspecified: Secondary | ICD-10-CM | POA: Diagnosis present

## 2019-02-08 DIAGNOSIS — M1712 Unilateral primary osteoarthritis, left knee: Secondary | ICD-10-CM | POA: Diagnosis not present

## 2019-02-08 DIAGNOSIS — Z1159 Encounter for screening for other viral diseases: Secondary | ICD-10-CM

## 2019-02-08 DIAGNOSIS — G8918 Other acute postprocedural pain: Secondary | ICD-10-CM | POA: Diagnosis not present

## 2019-02-08 HISTORY — PX: TOTAL KNEE ARTHROPLASTY: SHX125

## 2019-02-08 LAB — TYPE AND SCREEN
ABO/RH(D): O NEG
Antibody Screen: NEGATIVE

## 2019-02-08 SURGERY — ARTHROPLASTY, KNEE, TOTAL
Anesthesia: Spinal | Site: Knee | Laterality: Left

## 2019-02-08 MED ORDER — ACETAMINOPHEN 10 MG/ML IV SOLN: 1000.0000 mg | Freq: Once | INTRAVENOUS | Status: DC | PRN

## 2019-02-08 MED ORDER — FLEET ENEMA 7-19 GM/118ML RE ENEM: 1.0000 | ENEMA | Freq: Once | RECTAL | Status: DC | PRN

## 2019-02-08 MED ORDER — METOCLOPRAMIDE HCL 5 MG/ML IJ SOLN: 5.0000 mg | Freq: Three times a day (TID) | INTRAMUSCULAR | Status: DC | PRN

## 2019-02-08 MED ORDER — ACETAMINOPHEN 500 MG PO TABS
1000.0000 mg | ORAL_TABLET | Freq: Four times a day (QID) | ORAL | Status: DC
  Administered 2019-02-08 – 2019-02-09 (×3): 1000 mg via ORAL
  Filled 2019-02-08 (×3): qty 2

## 2019-02-08 MED ORDER — GABAPENTIN 300 MG PO CAPS
300.0000 mg | ORAL_CAPSULE | Freq: Three times a day (TID) | ORAL | Status: DC
  Administered 2019-02-08 – 2019-02-09 (×2): 300 mg via ORAL
  Filled 2019-02-08 (×2): qty 1

## 2019-02-08 MED ORDER — PROPOFOL 500 MG/50ML IV EMUL
INTRAVENOUS | Status: DC | PRN
  Administered 2019-02-08: 50 ug/kg/min via INTRAVENOUS

## 2019-02-08 MED ORDER — 0.9 % SODIUM CHLORIDE (POUR BTL) OPTIME
TOPICAL | Status: DC | PRN
  Administered 2019-02-08: 15:00:00 1000 mL

## 2019-02-08 MED ORDER — STERILE WATER FOR IRRIGATION IR SOLN
Status: DC | PRN
  Administered 2019-02-08: 2000 mL

## 2019-02-08 MED ORDER — PROPOFOL 10 MG/ML IV BOLUS
INTRAVENOUS | Status: AC
  Filled 2019-02-08: qty 40

## 2019-02-08 MED ORDER — DOCUSATE SODIUM 100 MG PO CAPS
100.0000 mg | ORAL_CAPSULE | Freq: Two times a day (BID) | ORAL | Status: DC
  Administered 2019-02-08 – 2019-02-09 (×2): 100 mg via ORAL
  Filled 2019-02-08 (×2): qty 1

## 2019-02-08 MED ORDER — ONDANSETRON HCL 4 MG PO TABS: 4.0000 mg | ORAL_TABLET | Freq: Four times a day (QID) | ORAL | Status: DC | PRN

## 2019-02-08 MED ORDER — AMLODIPINE BESYLATE 5 MG PO TABS
2.5000 mg | ORAL_TABLET | Freq: Every day | ORAL | Status: DC
  Administered 2019-02-09: 2.5 mg via ORAL
  Filled 2019-02-08: qty 1

## 2019-02-08 MED ORDER — SODIUM CHLORIDE 0.9 % IR SOLN
Status: DC | PRN
  Administered 2019-02-08: 1000 mL

## 2019-02-08 MED ORDER — ASPIRIN EC 325 MG PO TBEC
325.0000 mg | DELAYED_RELEASE_TABLET | Freq: Two times a day (BID) | ORAL | Status: DC
  Administered 2019-02-09: 325 mg via ORAL
  Filled 2019-02-08: qty 1

## 2019-02-08 MED ORDER — DEXAMETHASONE SODIUM PHOSPHATE 10 MG/ML IJ SOLN
INTRAMUSCULAR | Status: AC
  Filled 2019-02-08: qty 1

## 2019-02-08 MED ORDER — POVIDONE-IODINE 10 % EX SWAB
2.0000 "application " | Freq: Once | CUTANEOUS | Status: AC
  Administered 2019-02-08: 2 via TOPICAL

## 2019-02-08 MED ORDER — EPHEDRINE 5 MG/ML INJ
INTRAVENOUS | Status: AC
  Filled 2019-02-08: qty 10

## 2019-02-08 MED ORDER — ONDANSETRON HCL 4 MG/2ML IJ SOLN: 4.0000 mg | Freq: Once | INTRAMUSCULAR | Status: DC | PRN

## 2019-02-08 MED ORDER — CEFAZOLIN SODIUM-DEXTROSE 2-4 GM/100ML-% IV SOLN
2.0000 g | Freq: Four times a day (QID) | INTRAVENOUS | Status: AC
  Administered 2019-02-08 – 2019-02-09 (×2): 2 g via INTRAVENOUS
  Filled 2019-02-08 (×2): qty 100

## 2019-02-08 MED ORDER — MEPERIDINE HCL 50 MG/ML IJ SOLN: 6.2500 mg | INTRAMUSCULAR | Status: DC | PRN

## 2019-02-08 MED ORDER — ONDANSETRON HCL 4 MG/2ML IJ SOLN
INTRAMUSCULAR | Status: DC | PRN
  Administered 2019-02-08: 4 mg via INTRAVENOUS

## 2019-02-08 MED ORDER — MORPHINE SULFATE (PF) 2 MG/ML IV SOLN: 1.0000 mg | INTRAVENOUS | Status: DC | PRN

## 2019-02-08 MED ORDER — MIDAZOLAM HCL 2 MG/2ML IJ SOLN
1.0000 mg | INTRAMUSCULAR | Status: DC
  Administered 2019-02-08: 2 mg via INTRAVENOUS

## 2019-02-08 MED ORDER — PROPOFOL 10 MG/ML IV BOLUS
INTRAVENOUS | Status: DC | PRN
  Administered 2019-02-08 (×2): 20 mg via INTRAVENOUS

## 2019-02-08 MED ORDER — TRANEXAMIC ACID-NACL 1000-0.7 MG/100ML-% IV SOLN
1000.0000 mg | INTRAVENOUS | Status: AC
  Administered 2019-02-08: 1000 mg via INTRAVENOUS
  Filled 2019-02-08: qty 100

## 2019-02-08 MED ORDER — CHLORHEXIDINE GLUCONATE 4 % EX LIQD: 60.0000 mL | Freq: Once | CUTANEOUS | Status: DC

## 2019-02-08 MED ORDER — METOCLOPRAMIDE HCL 5 MG PO TABS: 5.0000 mg | ORAL_TABLET | Freq: Three times a day (TID) | ORAL | Status: DC | PRN

## 2019-02-08 MED ORDER — EPHEDRINE SULFATE-NACL 50-0.9 MG/10ML-% IV SOSY
PREFILLED_SYRINGE | INTRAVENOUS | Status: DC | PRN
  Administered 2019-02-08 (×3): 10 mg via INTRAVENOUS

## 2019-02-08 MED ORDER — FENTANYL CITRATE (PF) 100 MCG/2ML IJ SOLN
50.0000 ug | INTRAMUSCULAR | Status: DC
  Administered 2019-02-08: 50 ug via INTRAVENOUS

## 2019-02-08 MED ORDER — SODIUM CHLORIDE (PF) 0.9 % IJ SOLN
INTRAMUSCULAR | Status: AC
  Filled 2019-02-08: qty 10

## 2019-02-08 MED ORDER — FENTANYL CITRATE (PF) 100 MCG/2ML IJ SOLN
INTRAMUSCULAR | Status: AC
  Administered 2019-02-08: 50 ug via INTRAVENOUS
  Filled 2019-02-08: qty 2

## 2019-02-08 MED ORDER — DIPHENHYDRAMINE HCL 12.5 MG/5ML PO ELIX: 12.5000 mg | ORAL_SOLUTION | ORAL | Status: DC | PRN

## 2019-02-08 MED ORDER — LACTATED RINGERS IV SOLN
INTRAVENOUS | Status: DC
  Administered 2019-02-08 (×2): via INTRAVENOUS

## 2019-02-08 MED ORDER — ONDANSETRON HCL 4 MG/2ML IJ SOLN: 4.0000 mg | Freq: Four times a day (QID) | INTRAMUSCULAR | Status: DC | PRN

## 2019-02-08 MED ORDER — OXYCODONE HCL 5 MG PO TABS: 5.0000 mg | ORAL_TABLET | ORAL | Status: DC | PRN

## 2019-02-08 MED ORDER — HYDROMORPHONE HCL 1 MG/ML IJ SOLN: 0.2500 mg | INTRAMUSCULAR | Status: DC | PRN

## 2019-02-08 MED ORDER — BUPIVACAINE LIPOSOME 1.3 % IJ SUSP
INTRAMUSCULAR | Status: DC | PRN
  Administered 2019-02-08: 20 mL

## 2019-02-08 MED ORDER — ATORVASTATIN CALCIUM 10 MG PO TABS
10.0000 mg | ORAL_TABLET | Freq: Every day | ORAL | Status: DC
  Administered 2019-02-09: 10 mg via ORAL
  Filled 2019-02-08: qty 1

## 2019-02-08 MED ORDER — TRAMADOL HCL 50 MG PO TABS
50.0000 mg | ORAL_TABLET | Freq: Four times a day (QID) | ORAL | Status: DC | PRN
  Administered 2019-02-08: 50 mg via ORAL
  Administered 2019-02-08 – 2019-02-09 (×3): 100 mg via ORAL
  Filled 2019-02-08 (×4): qty 2

## 2019-02-08 MED ORDER — CEFAZOLIN SODIUM-DEXTROSE 2-4 GM/100ML-% IV SOLN
2.0000 g | INTRAVENOUS | Status: AC
  Administered 2019-02-08: 2 g via INTRAVENOUS
  Filled 2019-02-08: qty 100

## 2019-02-08 MED ORDER — MIDAZOLAM HCL 2 MG/2ML IJ SOLN
INTRAMUSCULAR | Status: AC
  Administered 2019-02-08: 2 mg via INTRAVENOUS
  Filled 2019-02-08: qty 2

## 2019-02-08 MED ORDER — MENTHOL 3 MG MT LOZG: 1.0000 | LOZENGE | OROMUCOSAL | Status: DC | PRN

## 2019-02-08 MED ORDER — SODIUM CHLORIDE (PF) 0.9 % IJ SOLN
INTRAMUSCULAR | Status: AC
  Filled 2019-02-08: qty 50

## 2019-02-08 MED ORDER — ONDANSETRON HCL 4 MG/2ML IJ SOLN
INTRAMUSCULAR | Status: AC
  Filled 2019-02-08: qty 2

## 2019-02-08 MED ORDER — DEXAMETHASONE SODIUM PHOSPHATE 10 MG/ML IJ SOLN
8.0000 mg | Freq: Once | INTRAMUSCULAR | Status: AC
  Administered 2019-02-08: 8 mg via INTRAVENOUS

## 2019-02-08 MED ORDER — METHOCARBAMOL 500 MG IVPB - SIMPLE MED
500.0000 mg | Freq: Four times a day (QID) | INTRAVENOUS | Status: DC | PRN
  Filled 2019-02-08: qty 50

## 2019-02-08 MED ORDER — METHOCARBAMOL 500 MG PO TABS
500.0000 mg | ORAL_TABLET | Freq: Four times a day (QID) | ORAL | Status: DC | PRN
  Administered 2019-02-08: 500 mg via ORAL
  Filled 2019-02-08: qty 1

## 2019-02-08 MED ORDER — SODIUM CHLORIDE (PF) 0.9 % IJ SOLN
INTRAMUSCULAR | Status: DC | PRN
  Administered 2019-02-08: 60 mL

## 2019-02-08 MED ORDER — BISACODYL 10 MG RE SUPP: 10.0000 mg | Freq: Every day | RECTAL | Status: DC | PRN

## 2019-02-08 MED ORDER — POLYETHYLENE GLYCOL 3350 17 G PO PACK: 17.0000 g | PACK | Freq: Every day | ORAL | Status: DC | PRN

## 2019-02-08 MED ORDER — DEXAMETHASONE SODIUM PHOSPHATE 10 MG/ML IJ SOLN
10.0000 mg | Freq: Once | INTRAMUSCULAR | Status: AC
  Administered 2019-02-09: 10 mg via INTRAVENOUS
  Filled 2019-02-08: qty 1

## 2019-02-08 MED ORDER — PHENOL 1.4 % MT LIQD: 1.0000 | OROMUCOSAL | Status: DC | PRN

## 2019-02-08 MED ORDER — PHENYLEPHRINE 40 MCG/ML (10ML) SYRINGE FOR IV PUSH (FOR BLOOD PRESSURE SUPPORT)
PREFILLED_SYRINGE | INTRAVENOUS | Status: AC
  Filled 2019-02-08: qty 10

## 2019-02-08 MED ORDER — SODIUM CHLORIDE 0.9 % IV SOLN
INTRAVENOUS | Status: DC
  Administered 2019-02-08 – 2019-02-09 (×2): via INTRAVENOUS

## 2019-02-08 MED ORDER — ACETAMINOPHEN 10 MG/ML IV SOLN
1000.0000 mg | Freq: Four times a day (QID) | INTRAVENOUS | Status: DC
  Administered 2019-02-08: 1000 mg via INTRAVENOUS
  Filled 2019-02-08: qty 100

## 2019-02-08 SURGICAL SUPPLY — 61 items
ATTUNE MED DOME PAT 41 KNEE (Knees) ×1 IMPLANT
ATTUNE MED DOME PAT 41MM KNEE (Knees) ×1 IMPLANT
ATTUNE PS FEM LT SZ 7 CEM KNEE (Femur) ×3 IMPLANT
ATTUNE PSRP INSR SZ7 8 KNEE (Insert) ×1 IMPLANT
ATTUNE PSRP INSR SZ7 8MM KNEE (Insert) ×1 IMPLANT
BAG SPEC THK2 15X12 ZIP CLS (MISCELLANEOUS) ×1
BAG ZIPLOCK 12X15 (MISCELLANEOUS) ×3 IMPLANT
BANDAGE ACE 6X5 VEL STRL LF (GAUZE/BANDAGES/DRESSINGS) ×6 IMPLANT
BASE TIBIAL ROT PLAT SZ 7 KNEE (Knees) ×1 IMPLANT
BLADE SAG 18X100X1.27 (BLADE) ×3 IMPLANT
BLADE SAW SGTL 11.0X1.19X90.0M (BLADE) ×3 IMPLANT
BLADE SURG SZ10 CARB STEEL (BLADE) ×6 IMPLANT
BOWL SMART MIX CTS (DISPOSABLE) ×3 IMPLANT
BSPLAT TIB 7 CMNT ROT PLAT STR (Knees) ×1 IMPLANT
CEMENT HV SMART SET (Cement) ×6 IMPLANT
CLOSURE WOUND 1/2 X4 (GAUZE/BANDAGES/DRESSINGS) ×1
COVER SURGICAL LIGHT HANDLE (MISCELLANEOUS) ×3 IMPLANT
COVER WAND RF STERILE (DRAPES) IMPLANT
CUFF TOURN SGL QUICK 34 (TOURNIQUET CUFF) ×3
CUFF TRNQT CYL 34X4.125X (TOURNIQUET CUFF) ×1 IMPLANT
DECANTER SPIKE VIAL GLASS SM (MISCELLANEOUS) ×3 IMPLANT
DRAPE U-SHAPE 47X51 STRL (DRAPES) ×3 IMPLANT
DRSG ADAPTIC 3X8 NADH LF (GAUZE/BANDAGES/DRESSINGS) ×3 IMPLANT
DRSG PAD ABDOMINAL 8X10 ST (GAUZE/BANDAGES/DRESSINGS) ×3 IMPLANT
DURAPREP 26ML APPLICATOR (WOUND CARE) ×3 IMPLANT
ELECT REM PT RETURN 15FT ADLT (MISCELLANEOUS) ×3 IMPLANT
EVACUATOR 1/8 PVC DRAIN (DRAIN) ×3 IMPLANT
GAUZE SPONGE 4X4 12PLY STRL (GAUZE/BANDAGES/DRESSINGS) ×3 IMPLANT
GLOVE BIO SURGEON STRL SZ7 (GLOVE) ×3 IMPLANT
GLOVE BIO SURGEON STRL SZ8 (GLOVE) ×3 IMPLANT
GLOVE BIOGEL PI IND STRL 6.5 (GLOVE) ×1 IMPLANT
GLOVE BIOGEL PI IND STRL 7.0 (GLOVE) ×1 IMPLANT
GLOVE BIOGEL PI IND STRL 8 (GLOVE) ×1 IMPLANT
GLOVE BIOGEL PI INDICATOR 6.5 (GLOVE) ×2
GLOVE BIOGEL PI INDICATOR 7.0 (GLOVE) ×2
GLOVE BIOGEL PI INDICATOR 8 (GLOVE) ×2
GLOVE SURG SS PI 6.5 STRL IVOR (GLOVE) ×3 IMPLANT
GOWN STRL REUS W/TWL LRG LVL3 (GOWN DISPOSABLE) ×9 IMPLANT
HANDPIECE INTERPULSE COAX TIP (DISPOSABLE) ×3
HOLDER FOLEY CATH W/STRAP (MISCELLANEOUS) IMPLANT
IMMOBILIZER KNEE 20 (SOFTGOODS) ×6
IMMOBILIZER KNEE 20 THIGH 36 (SOFTGOODS) ×1 IMPLANT
KIT TURNOVER KIT A (KITS) IMPLANT
MANIFOLD NEPTUNE II (INSTRUMENTS) ×3 IMPLANT
NS IRRIG 1000ML POUR BTL (IV SOLUTION) ×3 IMPLANT
PACK TOTAL KNEE CUSTOM (KITS) ×3 IMPLANT
PADDING CAST COTTON 6X4 STRL (CAST SUPPLIES) ×7 IMPLANT
PIN STEINMAN FIXATION KNEE (PIN) ×2 IMPLANT
PROTECTOR NERVE ULNAR (MISCELLANEOUS) ×3 IMPLANT
SET HNDPC FAN SPRY TIP SCT (DISPOSABLE) ×1 IMPLANT
STRIP CLOSURE SKIN 1/2X4 (GAUZE/BANDAGES/DRESSINGS) ×3 IMPLANT
SUT MNCRL AB 4-0 PS2 18 (SUTURE) ×3 IMPLANT
SUT STRATAFIX 0 PDS 27 VIOLET (SUTURE) ×3
SUT VIC AB 2-0 CT1 27 (SUTURE) ×9
SUT VIC AB 2-0 CT1 TAPERPNT 27 (SUTURE) ×3 IMPLANT
SUTURE STRATFX 0 PDS 27 VIOLET (SUTURE) ×1 IMPLANT
TIBIAL BASE ROT PLAT SZ 7 KNEE (Knees) ×3 IMPLANT
TRAY FOLEY MTR SLVR 16FR STAT (SET/KITS/TRAYS/PACK) ×3 IMPLANT
WATER STERILE IRR 1000ML POUR (IV SOLUTION) ×6 IMPLANT
WRAP KNEE MAXI GEL POST OP (GAUZE/BANDAGES/DRESSINGS) ×3 IMPLANT
YANKAUER SUCT BULB TIP 10FT TU (MISCELLANEOUS) ×3 IMPLANT

## 2019-02-08 NOTE — Op Note (Signed)
OPERATIVE REPORT-TOTAL KNEE ARTHROPLASTY   Pre-operative diagnosis- Osteoarthritis  Left knee(s)  Post-operative diagnosis- Osteoarthritis Left knee(s)  Procedure-  Left  Total Knee Arthroplasty  Surgeon- Christopher Plover. Elea Holtzclaw, MD  Assistant- Ardeen Jourdain, PA-C   Anesthesia-  Adductor canal block and spinal  EBL- 25 ml   Drains Hemovac  Tourniquet time-  Total Tourniquet Time Documented: Thigh (Left) - 43 minutes Total: Thigh (Left) - 43 minutes     Complications- None  Condition-PACU - hemodynamically stable.   Brief Clinical Note   Christopher Gibbs. is a 73 y.o. year old male with end stage OA of his left knee with progressively worsening pain and dysfunction. He has constant pain, with activity and at rest and significant functional deficits with difficulties even with ADLs. He has had extensive non-op management including analgesics, injections of cortisone and viscosupplements, and home exercise program, but remains in significant pain with significant dysfunction. Radiographs show bone on bone arthritis bone on bone medial and patellofemoral. He presents now for left Total Knee Arthroplasty.     Procedure in detail---   The patient is brought into the operating room and positioned supine on the operating table. After successful administration of  Adductor canal block and spinal,   a tourniquet is placed high on the  Left thigh(s) and the lower extremity is prepped and draped in the usual sterile fashion. Time out is performed by the operating team and then the  Left lower extremity is wrapped in Esmarch, knee flexed and the tourniquet inflated to 300 mmHg.       A midline incision is made with a ten blade through the subcutaneous tissue to the level of the extensor mechanism. A fresh blade is used to make a medial parapatellar arthrotomy. Soft tissue over the proximal medial tibia is subperiosteally elevated to the joint line with a knife and into the semimembranosus bursa  with a Cobb elevator. Soft tissue over the proximal lateral tibia is elevated with attention being paid to avoiding the patellar tendon on the tibial tubercle. The patella is everted, knee flexed 90 degrees and the ACL and PCL are removed. Findings are bone on bone medial and patellofemoral with massive global osteophytes        The drill is used to create a starting hole in the distal femur and the canal is thoroughly irrigated with sterile saline to remove the fatty contents. The 5 degree Left  valgus alignment guide is placed into the femoral canal and the distal femoral cutting block is pinned to remove 10 mm off the distal femur. Resection is made with an oscillating saw.      The tibia is subluxed forward and the menisci are removed. The extramedullary alignment guide is placed referencing proximally at the medial aspect of the tibial tubercle and distally along the second metatarsal axis and tibial crest. The block is pinned to remove 50mm off the more deficient medial  side. Resection is made with an oscillating saw. Size 7is the most appropriate size for the tibia and the proximal tibia is prepared with the modular drill and keel punch for that size.      The femoral sizing guide is placed and size 7 is most appropriate. Rotation is marked off the epicondylar axis and confirmed by creating a rectangular flexion gap at 90 degrees. The size 7 cutting block is pinned in this rotation and the anterior, posterior and chamfer cuts are made with the oscillating saw. The intercondylar block is then  placed and that cut is made.      Trial size 7 tibial component, trial size 7 posterior stabilized femur and a 8  mm posterior stabilized rotating platform insert trial is placed. Full extension is achieved with excellent varus/valgus and anterior/posterior balance throughout full range of motion. The patella is everted and thickness measured to be 27  mm. Free hand resection is taken to 15 mm, a 41 template is  placed, lug holes are drilled, trial patella is placed, and it tracks normally. Osteophytes are removed off the posterior femur with the trial in place. All trials are removed and the cut bone surfaces prepared with pulsatile lavage. Cement is mixed and once ready for implantation, the size 7 tibial implant, size  7 posterior stabilized femoral component, and the size 41 patella are cemented in place and the patella is held with the clamp. The trial insert is placed and the knee held in full extension. The Exparel (20 ml mixed with 60 ml saline) is injected into the extensor mechanism, posterior capsule, medial and lateral gutters and subcutaneous tissues.  All extruded cement is removed and once the cement is hard the permanent 8 mm posterior stabilized rotating platform insert is placed into the tibial tray.      The wound is copiously irrigated with saline solution and the extensor mechanism closed over a hemovac drain with #1 V-loc suture. The tourniquet is released for a total tourniquet time of 43  minutes. Flexion against gravity is 140 degrees and the patella tracks normally. Subcutaneous tissue is closed with 2.0 vicryl and subcuticular with running 4.0 Monocryl. The incision is cleaned and dried and steri-strips and a bulky sterile dressing are applied. The limb is placed into a knee immobilizer and the patient is awakened and transported to recovery in stable condition.      Please note that a surgical assistant was a medical necessity for this procedure in order to perform it in a safe and expeditious manner. Surgical assistant was necessary to retract the ligaments and vital neurovascular structures to prevent injury to them and also necessary for proper positioning of the limb to allow for anatomic placement of the prosthesis.   Christopher Plover Merrell Rettinger, MD    02/08/2019, 3:19 PM

## 2019-02-08 NOTE — Anesthesia Postprocedure Evaluation (Signed)
Anesthesia Post Note  Patient: Christopher Gibbs.  Procedure(s) Performed: TOTAL KNEE ARTHROPLASTY (Left Knee)     Patient location during evaluation: Nursing Unit Anesthesia Type: Spinal Level of consciousness: oriented and awake and alert Pain management: pain level controlled Vital Signs Assessment: post-procedure vital signs reviewed and stable Respiratory status: spontaneous breathing and respiratory function stable Cardiovascular status: blood pressure returned to baseline and stable Postop Assessment: no headache, no backache, no apparent nausea or vomiting and patient able to bend at knees Anesthetic complications: no    Last Vitals:  Vitals:   02/08/19 1717 02/08/19 1814  BP: 129/75 131/73  Pulse: (!) 57 68  Resp: 13 20  Temp: (!) 36.3 C 36.6 C  SpO2: 96% 97%    Last Pain:  Vitals:   02/08/19 1913  TempSrc:   PainSc: 5                  Barnet Glasgow

## 2019-02-08 NOTE — Anesthesia Procedure Notes (Signed)
Procedure Name: MAC Date/Time: 02/08/2019 1:50 PM Performed by: West Pugh, CRNA Pre-anesthesia Checklist: Patient identified, Emergency Drugs available, Suction available, Patient being monitored and Timeout performed Patient Re-evaluated:Patient Re-evaluated prior to induction Oxygen Delivery Method: Simple face mask Preoxygenation: Pre-oxygenation with 100% oxygen Induction Type: IV induction Number of attempts: 1 Placement Confirmation: positive ETCO2 Dental Injury: Teeth and Oropharynx as per pre-operative assessment

## 2019-02-08 NOTE — Anesthesia Procedure Notes (Signed)
Anesthesia Regional Block: Adductor canal block   Pre-Anesthetic Checklist: ,, timeout performed, Correct Patient, Correct Site, Correct Laterality, Correct Procedure, Correct Position, site marked, Risks and benefits discussed,  Surgical consent,  Pre-op evaluation,  At surgeon's request and post-op pain management  Laterality: Lower and Left  Prep: chloraprep       Needles:  Injection technique: Single-shot  Needle Type: Echogenic Needle     Needle Length: 9cm  Needle Gauge: 22     Additional Needles:   Procedures:,,,, ultrasound used (permanent image in chart),,,,  Narrative:  Start time: 02/08/2019 1:06 PM End time: 02/08/2019 1:13 PM Injection made incrementally with aspirations every 5 mL.  Performed by: Personally  Anesthesiologist: Barnet Glasgow, MD  Additional Notes: Block assessed prior to surgery. Pt tolerated procedure well.

## 2019-02-08 NOTE — Care Plan (Signed)
Ortho Bundle Case Management Note  Patient Details  Name: Christopher Gibbs. MRN: [id number] Date of Birth: [birth year]  L TKA on 02-08-19 DCP:  Home with spouse.  2 story home with 3 ste. DME:  No needs.  Has a RW and 3-in-1. PT:  [facility].  PT eval scheduled on 02-11-19.    DME Arranged:  N/A DME Agency:  NA  HH Arranged:  NA HH Agency:  NA  Additional Comments: Please contact me with any questions of if this plan should need to change.  Marianne Sofia, RN,CCM [facility]  [phone] 02/08/2019, 12:20 PM

## 2019-02-08 NOTE — Plan of Care (Signed)
progressing 

## 2019-02-08 NOTE — Discharge Instructions (Signed)
° °Dr. Frank [name] °Total Joint Specialist °[facility] °[address] °[place], [place] [address] °[phone] ° °TOTAL KNEE REPLACEMENT POSTOPERATIVE DIRECTIONS ° °Knee Rehabilitation, Guidelines Following Surgery  °Results after knee surgery are often greatly improved when you follow the exercise, range of motion and muscle strengthening exercises prescribed by your doctor. Safety measures are also important to protect the knee from further injury. Any time any of these exercises cause you to have increased pain or swelling in your knee joint, decrease the amount until you are comfortable again and slowly increase them. If you have problems or questions, call your caregiver or physical therapist for advice.  ° °HOME CARE INSTRUCTIONS  °• Remove items at home which could result in a fall. This includes throw rugs or furniture in walking pathways.  °· ICE to the affected knee every three hours for 30 minutes at a time and then as needed for pain and swelling.  Continue to use ice on the knee for pain and swelling from surgery. You may notice swelling that will progress down to the foot and ankle.  This is normal after surgery.  Elevate the leg when you are not up walking on it.   °· Continue to use the breathing machine which will help keep your temperature down.  It is common for your temperature to cycle up and down following surgery, especially at night when you are not up moving around and exerting yourself.  The breathing machine keeps your lungs expanded and your temperature down. °· Do not place pillow under knee, focus on keeping the knee straight while resting ° °DIET °You may resume your previous home diet once your are discharged from the hospital. ° °DRESSING / WOUND CARE / SHOWERING °You may change your dressing 3-5 days after surgery.  Then change the dressing every day with sterile gauze.  Please use good hand washing techniques before changing the dressing.  Do not use any lotions  or creams on the incision until instructed by your surgeon. °You may start showering once you are discharged home but do not submerge the incision under water. Just pat the incision dry and apply a dry gauze dressing on daily. °Change the surgical dressing daily and reapply a dry dressing each time. ° °ACTIVITY °Walk with your walker as instructed. °Use walker as long as suggested by your caregivers. °Avoid periods of inactivity such as sitting longer than an hour when not asleep. This helps prevent blood clots.  °You may resume a sexual relationship in one month or when given the OK by your doctor.  °You may return to work once you are cleared by your doctor.  °Do not drive a car for 6 weeks or until released by you surgeon.  °Do not drive while taking narcotics. ° °WEIGHT BEARING °Weight bearing as tolerated with assist device (walker, cane, etc) as directed, use it as long as suggested by your surgeon or therapist, typically at least 4-6 weeks. ° °POSTOPERATIVE CONSTIPATION PROTOCOL °Constipation - defined medically as fewer than three stools per week and severe constipation as less than one stool per week. ° °One of the most common issues patients have following surgery is constipation.  Even if you have a regular bowel pattern at home, your normal regimen is likely to be disrupted due to multiple reasons following surgery.  Combination of anesthesia, postoperative narcotics, change in appetite and fluid intake all can affect your bowels.  In order to avoid complications following surgery, here are some   recommendations in order to help you during your recovery period. ° °Colace (docusate) - Pick up an over-the-counter form of Colace or another stool softener and take twice a day as long as you are requiring postoperative pain medications.  Take with a full glass of water daily.  If you experience loose stools or diarrhea, hold the colace until you stool forms back up.  If your symptoms do not get better within 1  week or if they get worse, check with your doctor. ° °Dulcolax (bisacodyl) - Pick up over-the-counter and take as directed by the product packaging as needed to assist with the movement of your bowels.  Take with a full glass of water.  Use this product as needed if not relieved by Colace only.  ° °MiraLax (polyethylene glycol) - Pick up over-the-counter to have on hand.  MiraLax is a solution that will increase the amount of water in your bowels to assist with bowel movements.  Take as directed and can mix with a glass of water, juice, soda, coffee, or tea.  Take if you go more than two days without a movement. °Do not use MiraLax more than once per day. Call your doctor if you are still constipated or irregular after using this medication for 7 days in a row. ° °If you continue to have problems with postoperative constipation, please contact the office for further assistance and recommendations.  If you experience "the worst abdominal pain ever" or develop nausea or vomiting, please contact the office immediatly for further recommendations for treatment. ° °ITCHING °If you experience itching with your medications, try taking only a single pain pill, or even half a pain pill at a time.  You can also use Benadryl over the counter for itching or also to help with sleep.  ° °TED HOSE STOCKINGS °Wear the elastic stockings on both legs for three weeks following surgery during the day but you may remove then at night for sleeping. ° °MEDICATIONS °See your medication summary on the “After Visit Summary” that the nursing staff will review with you prior to discharge.  You may have some home medications which will be placed on hold until you complete the course of blood thinner medication.  It is important for you to complete the blood thinner medication as prescribed by your surgeon.  Continue your approved medications as instructed at time of discharge. ° °PRECAUTIONS °If you experience chest pain or shortness of breath -  call 911 immediately for transfer to the hospital emergency department.  °If you develop a fever greater that 101 F, purulent drainage from wound, increased redness or drainage from wound, foul odor from the wound/dressing, or calf pain - CONTACT YOUR SURGEON.   °                                                °FOLLOW-UP APPOINTMENTS °Make sure you keep all of your appointments after your operation with your surgeon and caregivers. You should call the office at the above phone number and make an appointment for approximately two weeks after the date of your surgery or on the date instructed by your surgeon outlined in the "After Visit Summary". ° °RANGE OF MOTION AND STRENGTHENING EXERCISES  °Rehabilitation of the knee is important following a knee injury or an operation. After just a few days of immobilization, the muscles of   the thigh which control the knee become weakened and shrink (atrophy). Knee exercises are designed to build up the tone and strength of the thigh muscles and to improve knee motion. Often times heat used for twenty to thirty minutes before working out will loosen up your tissues and help with improving the range of motion but do not use heat for the first two weeks following surgery. These exercises can be done on a training (exercise) mat, on the floor, on a table or on a bed. Use what ever works the best and is most comfortable for you Knee exercises include:  °• Leg Lifts - While your knee is still immobilized in a splint or cast, you can do straight leg raises. Lift the leg to 60 degrees, hold for 3 sec, and slowly lower the leg. Repeat 10-20 times 2-3 times daily. Perform this exercise against resistance later as your knee gets better.  °• Quad and Hamstring Sets - Tighten up the muscle on the front of the thigh (Quad) and hold for 5-10 sec. Repeat this 10-20 times hourly. Hamstring sets are done by pushing the foot backward against an object and holding for 5-10 sec. Repeat as with quad  sets.  °· Leg Slides: Lying on your back, slowly slide your foot toward your buttocks, bending your knee up off the floor (only go as far as is comfortable). Then slowly slide your foot back down until your leg is flat on the floor again. °· Angel Wings: Lying on your back spread your legs to the side as far apart as you can without causing discomfort.  °A rehabilitation program following serious knee injuries can speed recovery and prevent re-injury in the future due to weakened muscles. Contact your doctor or a physical therapist for more information on knee rehabilitation.  ° °IF YOU ARE TRANSFERRED TO A SKILLED REHAB FACILITY °If the patient is transferred to a skilled rehab facility following release from the hospital, a list of the current medications will be sent to the facility for the patient to continue.  When discharged from the skilled rehab facility, please have the facility set up the patient's Home Health Physical Therapy prior to being released. Also, the skilled facility will be responsible for providing the patient with their medications at time of release from the facility to include their pain medication, the muscle relaxants, and their blood thinner medication. If the patient is still at the rehab facility at time of the two week follow up appointment, the skilled rehab facility will also need to assist the patient in arranging follow up appointment in our office and any transportation needs. ° °MAKE SURE YOU:  °• Understand these instructions.  °• Get help right away if you are not doing well or get worse.  ° ° °Pick up stool softner and laxative for home use following surgery while on pain medications. °Do not submerge incision under water. °Please use good hand washing techniques while changing dressing each day. °May shower starting three days after surgery. °Please use a clean towel to pat the incision dry following showers. °Continue to use ice for pain and swelling after surgery. °Do not  use any lotions or creams on the incision until instructed by your surgeon. ° °

## 2019-02-08 NOTE — Interval H&P Note (Signed)
History and Physical Interval Note:  02/08/2019 11:40 AM  Christopher Gibbs.  has presented today for surgery, with the diagnosis of left knee osteoarthritis.  The various methods of treatment have been discussed with the patient and family. After consideration of risks, benefits and other options for treatment, the patient has consented to  Procedure(s) with comments: TOTAL KNEE ARTHROPLASTY (Left) - 95min as a surgical intervention.  The patient's history has been reviewed, patient examined, no change in status, stable for surgery.  I have reviewed the patient's chart and labs.  Questions were answered to the patient's satisfaction.     [name]

## 2019-02-08 NOTE — Transfer of Care (Signed)
Immediate Anesthesia Transfer of Care Note  Patient: Christopher Gibbs.  Procedure(s) Performed: TOTAL KNEE ARTHROPLASTY (Left Knee)  Patient Location: PACU  Anesthesia Type:Spinal and MAC combined with regional for post-op pain  Level of Consciousness: awake, alert , oriented and patient cooperative  Airway & Oxygen Therapy: Patient Spontanous Breathing and Patient connected to face mask oxygen  Post-op Assessment: Report given to RN and Post -op Vital signs reviewed and stable  Post vital signs: Reviewed and stable  Last Vitals:  Vitals Value Taken Time  BP 107/64 02/08/19 1535  Temp    Pulse 71 02/08/19 1538  Resp 13 02/08/19 1538  SpO2 100 % 02/08/19 1538  Vitals shown include unvalidated device data.  Last Pain:  Vitals:   02/08/19 1200  TempSrc: Oral  PainSc:       Patients Stated Pain Goal: 4 (16/60/60 0459)  Complications: No apparent anesthesia complications

## 2019-02-08 NOTE — Progress Notes (Signed)
AssistedDr. Houser with left, ultrasound guided, adductor canal block. Side rails up, monitors on throughout procedure. See vital signs in flow sheet. Tolerated Procedure well.  

## 2019-02-09 ENCOUNTER — Encounter (HOSPITAL_COMMUNITY): Payer: Self-pay | Admitting: Orthopedic Surgery

## 2019-02-09 DIAGNOSIS — M1712 Unilateral primary osteoarthritis, left knee: Secondary | ICD-10-CM | POA: Diagnosis not present

## 2019-02-09 DIAGNOSIS — Z1159 Encounter for screening for other viral diseases: Secondary | ICD-10-CM | POA: Diagnosis not present

## 2019-02-09 DIAGNOSIS — E669 Obesity, unspecified: Secondary | ICD-10-CM | POA: Diagnosis not present

## 2019-02-09 DIAGNOSIS — Z6833 Body mass index (BMI) 33.0-33.9, adult: Secondary | ICD-10-CM | POA: Diagnosis not present

## 2019-02-09 LAB — BASIC METABOLIC PANEL
Anion gap: 10 (ref 5–15)
BUN: 13 mg/dL (ref 8–23)
CO2: 21 mmol/L — ABNORMAL LOW (ref 22–32)
Calcium: 8.5 mg/dL — ABNORMAL LOW (ref 8.9–10.3)
Chloride: 104 mmol/L (ref 98–111)
Creatinine, Ser: 0.77 mg/dL (ref 0.61–1.24)
GFR calc Af Amer: >60 mL/min (ref >60)
GFR calc non Af Amer: >60 mL/min (ref >60)
Glucose, Bld: 175 mg/dL — ABNORMAL HIGH (ref 70–99)
Potassium: 4.2 mmol/L (ref 3.5–5.1)
Sodium: 135 mmol/L (ref 135–145)

## 2019-02-09 LAB — CBC
HCT: 36.6 % — ABNORMAL LOW (ref 39.0–52.0)
Hemoglobin: 12.3 g/dL — ABNORMAL LOW (ref 13.0–17.0)
MCH: 33.9 pg (ref 26.0–34.0)
MCHC: 33.6 g/dL (ref 30.0–36.0)
MCV: 100.8 fL — ABNORMAL HIGH (ref 80.0–100.0)
Platelets: 146 10*3/uL — ABNORMAL LOW (ref 150–400)
RBC: 3.63 MIL/uL — ABNORMAL LOW (ref 4.22–5.81)
RDW: 12.4 % (ref 11.5–15.5)
WBC: 7.9 10*3/uL (ref 4.0–10.5)
nRBC: 0 % (ref 0.0–0.2)

## 2019-02-09 MED ORDER — ASPIRIN 325 MG PO TBEC: 325.0000 mg | DELAYED_RELEASE_TABLET | Freq: Two times a day (BID) | ORAL | 0 refills | Status: AC

## 2019-02-09 MED ORDER — TRAMADOL HCL 50 MG PO TABS: 50.0000 mg | ORAL_TABLET | Freq: Four times a day (QID) | ORAL | 0 refills | Status: DC | PRN

## 2019-02-09 MED ORDER — OXYCODONE HCL 5 MG PO TABS: 5.0000 mg | ORAL_TABLET | Freq: Four times a day (QID) | ORAL | 0 refills | Status: DC | PRN

## 2019-02-09 MED ORDER — METHOCARBAMOL 500 MG PO TABS: 500.0000 mg | ORAL_TABLET | Freq: Four times a day (QID) | ORAL | 0 refills | Status: DC | PRN

## 2019-02-09 MED ORDER — GABAPENTIN 300 MG PO CAPS: 300.0000 mg | ORAL_CAPSULE | Freq: Three times a day (TID) | ORAL | 0 refills | Status: DC

## 2019-02-09 NOTE — Progress Notes (Signed)
Physical Therapy Treatment Patient Details Name: Christopher Gibbs. MRN: [id number] DOB: 1945/12/14 Today's Date: 02/09/2019    History of Present Illness Patient is a 73 y/o male admitted for L TKA.  PMH for R THA, HTN.    PT Comments    Patient progressing able to demonstrate stairs with cane and rail safe to access upper floor of home if desired with wife assist.  Continue to feel appropriate for follow up d/c as determined per MD.  Will plan for d/c today.   Follow Up Recommendations  Follow surgeon's recommendation for DC plan and follow-up therapies     Equipment Recommendations  None recommended by PT    Recommendations for Other Services       Precautions / Restrictions Precautions Precautions: Fall;Knee Restrictions Other Position/Activity Restrictions: WBAT    Mobility  Bed Mobility Overal bed mobility: Modified Independent             General bed mobility comments: sit <> supine unaided  Transfers   Equipment used: None Transfers: Sit to/from Stand Sit to Stand: Supervision         General transfer comment: stood prior to RW close and mild unsteadiness with S for safety  Ambulation/Gait Ambulation/Gait assistance: Supervision Gait Distance (Feet): 80 Feet Assistive device: Rolling walker (2 wheeled) Gait Pattern/deviations: Step-to pattern;Step-through pattern;Antalgic         Stairs Stairs: Yes Stairs assistance: Min guard Stair Management: One rail Right;With cane;Forwards Number of Stairs: 10 General stair comments: cues for sequence, technique, how wife will assist to change out DME and to guard on down side; issued handout for technique with stairs with rail and cane and with walker reverse technique   Wheelchair Mobility    Modified Rankin (Stroke Patients Only)       Balance Overall balance assessment: Mild deficits observed, not formally tested                                          Cognition  Arousal/Alertness: Awake/alert Behavior During Therapy: WFL for tasks assessed/performed Overall Cognitive Status: Within Functional Limits for tasks assessed                                        Exercises      General Comments General comments (skin integrity, edema, etc.): discussed car transfer      Pertinent Vitals/Pain Pain Score: 5  Pain Location: L knee Pain Intervention(s): Monitored during session;Repositioned;Premedicated before session    Home Living                      Prior Function            PT Goals (current goals can now be found in the care plan section) Progress towards PT goals: Progressing toward goals    Frequency    7X/week      PT Plan Current plan remains appropriate    Co-evaluation              AM-PAC PT "6 Clicks" Mobility   Outcome Measure  Help needed turning from your back to your side while in a flat bed without using bedrails?: None Help needed moving from lying on your back to sitting on the side of a flat bed without using  bedrails?: None Help needed moving to and from a bed to a chair (including a wheelchair)?: A Little Help needed standing up from a chair using your arms (e.g., wheelchair or bedside chair)?: None Help needed to walk in hospital room?: A Little Help needed climbing 3-5 steps with a railing? : A Little 6 Click Score: 21    End of Session Equipment Utilized During Treatment: Gait belt Activity Tolerance: Patient tolerated treatment well Patient left: in bed;with call bell/phone within reach Nurse Communication: Other (comment)(stable for d/c) PT Visit Diagnosis: Difficulty in walking, not elsewhere classified (R26.2);Unsteadiness on feet (R26.81)     Time: 1518-3437 PT Time Calculation (min) (ACUTE ONLY): 18 min  Charges:  $Gait Training: 8-22 mins                     Magda Kiel, [place] [phone] 02/09/2019   Reginia Naas 02/09/2019,  2:23 PM

## 2019-02-09 NOTE — Evaluation (Signed)
Physical Therapy Evaluation Patient Details Name: Christopher Gibbs. MRN: [id number] DOB: 1946/02/19 Today's Date: 02/09/2019   History of Present Illness  Patient is a 73 y/o male admitted for L TKA.  PMH for R THA, HTN.  Clinical Impression  Patient presents with decreased independence with mobility due to weakness, pain, limited knowledge of DME, precautions and will benefit from skilled PT in the acute setting to allow d/c home with family support and follow up PT per MD.  Will see prior to d/c today to educate on flight of stairs to access bedroom/full bath at home and for car transfer.     Follow Up Recommendations Follow surgeon's recommendation for DC plan and follow-up therapies    Equipment Recommendations  None recommended by PT    Recommendations for Other Services       Precautions / Restrictions Precautions Precautions: Fall;Knee Precaution Booklet Issued: Yes (comment) Required Braces or Orthoses: Knee Immobilizer - Left Knee Immobilizer - Left: On when out of bed or walking Restrictions Weight Bearing Restrictions: No Other Position/Activity Restrictions: WBAT      Mobility  Bed Mobility Overal bed mobility: Modified Independent             General bed mobility comments: sit to supine unaided  Transfers Overall transfer level: Needs assistance Equipment used: None Transfers: Sit to/from Stand Sit to Stand: Supervision         General transfer comment: stood prior to RW close and mild unsteadiness with S for safety  Ambulation/Gait Ambulation/Gait assistance: Supervision Gait Distance (Feet): 150 Feet Assistive device: Rolling walker (2 wheeled) Gait Pattern/deviations: Step-to pattern;Step-through pattern;Antalgic     General Gait Details: cues initially for sequence  Stairs Stairs: Yes Stairs assistance: Min assist Stair Management: No rails;Backwards;With walker Number of Stairs: 3 General stair comments: educated in sequence and  technique for home entry  Wheelchair Mobility    Modified Rankin (Stroke Patients Only)       Balance Overall balance assessment: Mild deficits observed, not formally tested                                           Pertinent Vitals/Pain Pain Assessment: 0-10 Pain Score: 2  Pain Location: L knee Pain Descriptors / Indicators: Dull;Sore Pain Intervention(s): Monitored during session;Repositioned;Ice applied    Home Living Family/patient expects to be discharged to:: Private residence   Available Help at Discharge: Family Type of Home: House Home Access: Stairs to enter Entrance Stairs-Rails: None Entrance Stairs-Number of Steps: 2 Home Layout: Two level Home Equipment: Environmental consultant - 2 wheels;Cane - single point;Shower seat;Bedside commode      Prior Function Level of Independence: Independent               Hand Dominance        Extremity/Trunk Assessment   Upper Extremity Assessment Upper Extremity Assessment: Overall WFL for tasks assessed    Lower Extremity Assessment Lower Extremity Assessment: LLE deficits/detail LLE Deficits / Details: AROM 18-83, able to perform SLR       Communication   Communication: No difficulties  Cognition Arousal/Alertness: Awake/alert Behavior During Therapy: WFL for tasks assessed/performed Overall Cognitive Status: Within Functional Limits for tasks assessed  General Comments      Exercises Total Joint Exercises Ankle Circles/Pumps: AROM;10 reps;Both;Seated Quad Sets: AROM;10 reps;Left;Seated Short Arc Quad: AROM;Left;10 reps;Seated Heel Slides: AROM;Left;10 reps;Seated Hip ABduction/ADduction: AROM;Left;10 reps;Seated Straight Leg Raises: AROM;Left;10 reps;Seated Long Arc Quad: AROM;Left;10 reps;Seated Knee Flexion: AROM;Left;10 reps;Seated Goniometric ROM: 18-83   Assessment/Plan    PT Assessment Patient needs continued PT services   PT Problem List Decreased range of motion;Decreased strength;Decreased mobility;Decreased knowledge of use of DME;Decreased balance;Pain       PT Treatment Interventions DME instruction;Stair training;Therapeutic activities;Balance training;Gait training;Functional mobility training;Therapeutic exercise;Patient/family education    PT Goals (Current goals can be found in the Care Plan section)  Acute Rehab PT Goals Patient Stated Goal: to go home today PT Goal Formulation: With patient Time For Goal Achievement: 02/11/19 Potential to Achieve Goals: Good    Frequency 7X/week   Barriers to discharge        Co-evaluation               AM-PAC PT "6 Clicks" Mobility  Outcome Measure Help needed turning from your back to your side while in a flat bed without using bedrails?: None Help needed moving from lying on your back to sitting on the side of a flat bed without using bedrails?: None Help needed moving to and from a bed to a chair (including a wheelchair)?: A Little Help needed standing up from a chair using your arms (e.g., wheelchair or bedside chair)?: A Little Help needed to walk in hospital room?: A Little Help needed climbing 3-5 steps with a railing? : A Little 6 Click Score: 20    End of Session Equipment Utilized During Treatment: Gait belt Activity Tolerance: Patient tolerated treatment well Patient left: in bed;with call bell/phone within reach   PT Visit Diagnosis: Difficulty in walking, not elsewhere classified (R26.2);Unsteadiness on feet (R26.81)    Time: 0258-5277 PT Time Calculation (min) (ACUTE ONLY): 31 min   Charges:   PT Evaluation $PT Eval Low Complexity: 1 Low PT Treatments $Gait Training: 8-22 mins        Magda Kiel, [place] [facility] [phone] 02/09/2019   Reginia Naas 02/09/2019, 9:43 AM

## 2019-02-09 NOTE — Progress Notes (Signed)
   Subjective: 1 Day Post-Op Procedure(s) (LRB): TOTAL KNEE ARTHROPLASTY (Left) Patient reports pain as mild.   Patient seen in rounds by Dr. Wynelle Link. Patient is well, and has had no acute complaints or problems. No issues overnight, foley catheter removed this AM. Denies chest pain, SOB, or calf pain. We will begin therapy today.   Objective: Vital signs in last 24 hours: Temp:  [97.4 F (36.3 C)-98.5 F (36.9 C)] 97.7 F (36.5 C) (06/30 0610) Pulse Rate:  [56-80] 58 (06/30 0610) Resp:  [10-20] 16 (06/30 0610) BP: (107-167)/(64-83) 125/80 (06/30 0610) SpO2:  [93 %-100 %] 99 % (06/30 0610) Weight:  [109.8 kg] 109.8 kg (06/29 1142)  Intake/Output from previous day:  Intake/Output Summary (Last 24 hours) at 02/09/2019 0740 Last data filed at 02/09/2019 0612 Gross per 24 hour  Intake 4035.22 ml  Output 4535 ml  Net -499.78 ml    Labs: Recent Labs    02/09/19 0333  HGB 12.3*   Recent Labs    02/09/19 0333  WBC 7.9  RBC 3.63*  HCT 36.6*  PLT 146*   Recent Labs    02/09/19 0333  NA 135  K 4.2  CL 104  CO2 21*  BUN 13  CREATININE 0.77  GLUCOSE 175*  CALCIUM 8.5*   Exam: General - Patient is Alert and Oriented Extremity - Neurologically intact Neurovascular intact Sensation intact distally Dorsiflexion/Plantar flexion intact Dressing - dressing C/D/I Motor Function - intact, moving foot and toes well on exam.   Past Medical History:  Diagnosis Date  . Arthritis    Arthritis knees- rt. hip  . Hypertension    tends to elevate in MD office.    Assessment/Plan: 1 Day Post-Op Procedure(s) (LRB): TOTAL KNEE ARTHROPLASTY (Left) Principal Problem:   OA (osteoarthritis) of knee  Estimated body mass index is 33.75 kg/m as calculated from the following:   Height as of this encounter: 5\' 11"  (1.803 m).   Weight as of this encounter: 109.8 kg. Advance diet Up with therapy D/C IV fluids  Anticipated LOS equal to or greater than 2 midnights due to - Age  71 and older with one or more of the following:  - Obesity  - Expected need for hospital services (PT, OT, Nursing) required for safe  discharge  - Anticipated need for postoperative skilled nursing care or inpatient rehab  - Active co-morbidities: None OR   - Unanticipated findings during/Post Surgery: None  - Patient is a high risk of re-admission due to: None    DVT Prophylaxis - Aspirin Weight bearing as tolerated. D/C O2 and pulse ox and try on room air. Hemovac pulled without difficulty, will begin therapy today.  Plan is to go Home after hospital stay. Plan for discharge later today if progresses with therapy and meeting his goals. Scheduled for outpatient [facility] at [facility]. Follow-up in the office in 2 weeks.   Theresa Duty, PA-C Orthopedic Surgery 02/09/2019, 7:40 AM

## 2019-02-11 DIAGNOSIS — M25662 Stiffness of left knee, not elsewhere classified: Secondary | ICD-10-CM | POA: Diagnosis not present

## 2019-02-15 DIAGNOSIS — M25662 Stiffness of left knee, not elsewhere classified: Secondary | ICD-10-CM | POA: Diagnosis not present

## 2019-02-19 DIAGNOSIS — M25562 Pain in left knee: Secondary | ICD-10-CM | POA: Diagnosis not present

## 2019-02-23 DIAGNOSIS — M25562 Pain in left knee: Secondary | ICD-10-CM | POA: Diagnosis not present

## 2019-02-25 DIAGNOSIS — M25562 Pain in left knee: Secondary | ICD-10-CM | POA: Diagnosis not present

## 2019-03-02 DIAGNOSIS — M25562 Pain in left knee: Secondary | ICD-10-CM | POA: Diagnosis not present

## 2019-03-05 DIAGNOSIS — M25562 Pain in left knee: Secondary | ICD-10-CM | POA: Diagnosis not present

## 2019-03-09 DIAGNOSIS — M25562 Pain in left knee: Secondary | ICD-10-CM | POA: Diagnosis not present

## 2019-03-12 DIAGNOSIS — M25562 Pain in left knee: Secondary | ICD-10-CM | POA: Diagnosis not present

## 2019-03-15 DIAGNOSIS — M25562 Pain in left knee: Secondary | ICD-10-CM | POA: Diagnosis not present

## 2019-03-22 DIAGNOSIS — M25562 Pain in left knee: Secondary | ICD-10-CM | POA: Diagnosis not present

## 2019-03-29 DIAGNOSIS — M25562 Pain in left knee: Secondary | ICD-10-CM | POA: Diagnosis not present

## 2019-03-30 DIAGNOSIS — Z96652 Presence of left artificial knee joint: Secondary | ICD-10-CM | POA: Diagnosis not present

## 2019-03-30 DIAGNOSIS — Z471 Aftercare following joint replacement surgery: Secondary | ICD-10-CM | POA: Diagnosis not present

## 2019-03-31 DIAGNOSIS — M25562 Pain in left knee: Secondary | ICD-10-CM | POA: Diagnosis not present

## 2019-04-05 DIAGNOSIS — M25562 Pain in left knee: Secondary | ICD-10-CM | POA: Diagnosis not present

## 2019-04-27 DIAGNOSIS — Z23 Encounter for immunization: Secondary | ICD-10-CM | POA: Diagnosis not present

## 2019-05-19 ENCOUNTER — Encounter (HOSPITAL_COMMUNITY): Payer: Self-pay

## 2019-05-19 NOTE — Patient Instructions (Signed)
DUE TO COVID-19 ONLY ONE VISITOR IS ALLOWED TO COME WITH YOU AND STAY IN THE WAITING ROOM ONLY DURING PRE OP AND PROCEDURE. THE ONE VISITOR MAY VISIT WITH YOU IN YOUR PRIVATE ROOM DURING VISITING HOURS ONLY!!   COVID SWAB TESTING MUST BE COMPLETED ON: Today, Immediately after pre op appointment.    [address] [place] -[facility] enter pre surgical testing line (Must self quarantine after testing. Follow instructions on handout.)           Your procedure is scheduled on: Monday, Oct. 12, 2020   Report to [facility] Main  Entrance    Report to admitting at 8:00 AM   Call this number if you have problems the morning of surgery [phone]   Do not eat food :After Midnight.   May have liquids until 7:30 AM day of surgery   CLEAR LIQUID DIET  Foods Allowed                                                                     Foods Excluded  Water, Black Coffee and tea, regular and decaf                             liquids that you cannot  Plain Jell-O in any flavor  (No red)                                           see through such as: Fruit ices (not with fruit pulp)                                     milk, soups, orange juice  Iced Popsicles (No red)                                    All solid food Carbonated beverages, regular and diet                                    Apple juices Sports drinks like Gatorade (No red) Lightly seasoned clear broth or consume(fat free) Sugar, honey syrup  Sample Menu Breakfast                                Lunch                                     Supper Cranberry juice                    Beef broth                            Chicken broth Jell-O  Grape juice                           Apple juice Coffee or tea                        Jell-O                                      Popsicle                                                Coffee or tea                         Coffee or tea   Complete one Ensure drink the morning of surgery at 7:30AM the day of surgery.   Brush your teeth the morning of surgery.   Do NOT smoke after Midnight   Take these medicines the morning of surgery with A SIP OF WATER: Amlodipine, Atorvastatin                               You may not have any metal on your body including jewelry, and body piercings             Do not wear  lotions, powders, perfumes/cologne, or deodorant                         Men may shave face and neck.   Do not bring valuables to the hospital. [place].   Contacts, dentures or bridgework may not be worn into surgery.   Bring small overnight bag day of surgery.    Special Instructions: Bring a copy of your healthcare power of attorney and living will documents         the day of surgery if you haven't scanned them in before.              Please read over the following fact sheets you were given:  [facility] - Preparing for Surgery Before surgery, you can play an important role.  Because skin is not sterile, your skin needs to be as free of germs as possible.  You can reduce the number of germs on your skin by washing with CHG (chlorahexidine gluconate) soap before surgery.  CHG is an antiseptic cleaner which kills germs and bonds with the skin to continue killing germs even after washing. Please DO NOT use if you have an allergy to CHG or antibacterial soaps.  If your skin becomes reddened/irritated stop using the CHG and inform your nurse when you arrive at Short Stay. Do not shave (including legs and underarms) for at least 48 hours prior to the first CHG shower.  You may shave your face/neck.  Please follow these instructions carefully:  1.  Shower with CHG Soap the night before surgery and the  morning of surgery.  2.  If you choose to wash your hair, wash your hair first as usual with your normal  shampoo.  3.  After you shampoo, rinse your  hair and body thoroughly to remove the shampoo.                             4.  Use CHG as you would any other liquid soap.  You can apply chg directly to the skin and wash.  Gently with a scrungie or clean washcloth.  5.  Apply the CHG Soap to your body ONLY FROM THE NECK DOWN.   Do   not use on face/ open                           Wound or open sores. Avoid contact with eyes, ears mouth and   genitals (private parts).                       Wash face,  Genitals (private parts) with your normal soap.             6.  Wash thoroughly, paying special attention to the area where your    surgery  will be performed.  7.  Thoroughly rinse your body with warm water from the neck down.  8.  DO NOT shower/wash with your normal soap after using and rinsing off the CHG Soap.                9.  Pat yourself dry with a clean towel.            10.  Wear clean pajamas.            11.  Place clean sheets on your bed the night of your first shower and do not  sleep with pets. Day of Surgery : Do not apply any lotions/deodorants the morning of surgery.  Please wear clean clothes to the hospital/surgery center.  FAILURE TO FOLLOW THESE INSTRUCTIONS MAY RESULT IN THE CANCELLATION OF YOUR SURGERY  PATIENT SIGNATURE_________________________________  NURSE SIGNATURE__________________________________  ________________________________________________________________________   Christopher Gibbs  An incentive spirometer is a tool that can help keep your lungs clear and active. This tool measures how well you are filling your lungs with each breath. Taking long deep breaths may help reverse or decrease the chance of developing breathing (pulmonary) problems (especially infection) following:  A long period of time when you are unable to move or be active. BEFORE THE PROCEDURE   If the spirometer includes an indicator to show your best effort, your nurse or respiratory therapist will set it to a desired  goal.  If possible, sit up straight or lean slightly forward. Try not to slouch.  Hold the incentive spirometer in an upright position. INSTRUCTIONS FOR USE  1. Sit on the edge of your bed if possible, or sit up as far as you can in bed or on a chair. 2. Hold the incentive spirometer in an upright position. 3. Breathe out normally. 4. Place the mouthpiece in your mouth and seal your lips tightly around it. 5. Breathe in slowly and as deeply as possible, raising the piston or the ball toward the top of the column. 6. Hold your breath for 3-5 seconds or for as long as possible. Allow the piston or ball to fall to the bottom of the column. 7. Remove the mouthpiece from your mouth and breathe out normally. 8. Rest for a few seconds and repeat Steps 1 through 7 at  least 10 times every 1-2 hours when you are awake. Take your time and take a few normal breaths between deep breaths. 9. The spirometer may include an indicator to show your best effort. Use the indicator as a goal to work toward during each repetition. 10. After each set of 10 deep breaths, practice coughing to be sure your lungs are clear. If you have an incision (the cut made at the time of surgery), support your incision when coughing by placing a pillow or rolled up towels firmly against it. Once you are able to get out of bed, walk around indoors and cough well. You may stop using the incentive spirometer when instructed by your caregiver.  RISKS AND COMPLICATIONS  Take your time so you do not get dizzy or light-headed.  If you are in pain, you may need to take or ask for pain medication before doing incentive spirometry. It is harder to take a deep breath if you are having pain. AFTER USE  Rest and breathe slowly and easily.  It can be helpful to keep track of a log of your progress. Your caregiver can provide you with a simple table to help with this. If you are using the spirometer at home, follow these instructions: [place] IF:   You are having difficultly using the spirometer.  You have trouble using the spirometer as often as instructed.  Your pain medication is not giving enough relief while using the spirometer.  You develop fever of 100.5 F (38.1 C) or higher. SEEK IMMEDIATE MEDICAL CARE IF:   You cough up bloody sputum that had not been present before.  You develop fever of 102 F (38.9 C) or greater.  You develop worsening pain at or near the incision site. MAKE SURE YOU:   Understand these instructions.  Will watch your condition.  Will get help right away if you are not doing well or get worse. Document Released: 12/09/2006 Document Revised: 10/21/2011 Document Reviewed: 02/09/2007 ExitCare Patient Information 2014 ExitCare, [place].   ________________________________________________________________________  WHAT IS A BLOOD TRANSFUSION? Blood Transfusion Information  A transfusion is the replacement of blood or some of its parts. Blood is made up of multiple cells which provide different functions.  Red blood cells carry oxygen and are used for blood loss replacement.  Cusic blood cells fight against infection.  Platelets control bleeding.  Plasma helps clot blood.  Other blood products are available for specialized needs, such as hemophilia or other clotting disorders. BEFORE THE TRANSFUSION  Who gives blood for transfusions?   Healthy volunteers who are fully evaluated to make sure their blood is safe. This is blood bank blood. Transfusion therapy is the safest it has ever been in the practice of medicine. Before blood is taken from a donor, a complete history is taken to make sure that person has no history of diseases nor engages in risky social behavior (examples are intravenous drug use or sexual activity with multiple partners). The donor's travel history is screened to minimize risk of transmitting infections, such as malaria. The donated blood is tested for  signs of infectious diseases, such as HIV and hepatitis. The blood is then tested to be sure it is compatible with you in order to minimize the chance of a transfusion reaction. If you or a relative donates blood, this is often done in anticipation of surgery and is not appropriate for emergency situations. It takes many days to process the donated blood. RISKS AND COMPLICATIONS Although transfusion  therapy is very safe and saves many lives, the main dangers of transfusion include:   Getting an infectious disease.  Developing a transfusion reaction. This is an allergic reaction to something in the blood you were given. Every precaution is taken to prevent this. The decision to have a blood transfusion has been considered carefully by your caregiver before blood is given. Blood is not given unless the benefits outweigh the risks. AFTER THE TRANSFUSION  Right after receiving a blood transfusion, you will usually feel much better and more energetic. This is especially true if your red blood cells have gotten low (anemic). The transfusion raises the level of the red blood cells which carry oxygen, and this usually causes an energy increase.  The nurse administering the transfusion will monitor you carefully for complications. HOME CARE INSTRUCTIONS  No special instructions are needed after a transfusion. You may find your energy is better. Speak with your caregiver about any limitations on activity for underlying diseases you may have. SEEK MEDICAL CARE IF:   Your condition is not improving after your transfusion.  You develop redness or irritation at the intravenous (IV) site. SEEK IMMEDIATE MEDICAL CARE IF:  Any of the following symptoms occur over the next 12 hours:  Shaking chills.  You have a temperature by mouth above 102 F (38.9 C), not controlled by medicine.  Chest, back, or muscle pain.  People around you feel you are not acting correctly or are confused.  Shortness of breath  or difficulty breathing.  Dizziness and fainting.  You get a rash or develop hives.  You have a decrease in urine output.  Your urine turns a dark color or changes to pink, red, or brown. Any of the following symptoms occur over the next 10 days:  You have a temperature by mouth above 102 F (38.9 C), not controlled by medicine.  Shortness of breath.  Weakness after normal activity.  The Blizard part of the eye turns yellow (jaundice).  You have a decrease in the amount of urine or are urinating less often.  Your urine turns a dark color or changes to pink, red, or brown. Document Released: 07/26/2000 Document Revised: 10/21/2011 Document Reviewed: 03/14/2008 [facility] Patient Information 2014 [place], [place].  _______________________________________________________________________

## 2019-05-20 ENCOUNTER — Other Ambulatory Visit: Payer: Self-pay

## 2019-05-20 ENCOUNTER — Other Ambulatory Visit (HOSPITAL_COMMUNITY)
Admission: RE | Admit: 2019-05-20 | Discharge: 2019-05-20 | Disposition: A | Discharge status: Home / Self Care | Payer: Medicare Other | Source: Ambulatory Visit | Attending: Orthopedic Surgery | Admitting: Orthopedic Surgery

## 2019-05-20 ENCOUNTER — Encounter (HOSPITAL_COMMUNITY): Payer: Self-pay

## 2019-05-20 ENCOUNTER — Encounter (HOSPITAL_COMMUNITY)
Admission: RE | Admit: 2019-05-20 | Discharge: 2019-05-20 | Disposition: A | Discharge status: Home / Self Care | Payer: Medicare Other | Source: Ambulatory Visit | Attending: Orthopedic Surgery | Admitting: Orthopedic Surgery

## 2019-05-20 DIAGNOSIS — M1711 Unilateral primary osteoarthritis, right knee: Secondary | ICD-10-CM | POA: Insufficient documentation

## 2019-05-20 DIAGNOSIS — Z20828 Contact with and (suspected) exposure to other viral communicable diseases: Secondary | ICD-10-CM | POA: Diagnosis not present

## 2019-05-20 DIAGNOSIS — Z01812 Encounter for preprocedural laboratory examination: Secondary | ICD-10-CM | POA: Insufficient documentation

## 2019-05-20 HISTORY — DX: Hyperlipidemia, unspecified: E78.5

## 2019-05-20 HISTORY — DX: Diverticulosis of intestine, part unspecified, without perforation or abscess without bleeding: K57.90

## 2019-05-20 LAB — CBC
HCT: 41.9 % (ref 39.0–52.0)
Hemoglobin: 13.9 g/dL (ref 13.0–17.0)
MCH: 32.2 pg (ref 26.0–34.0)
MCHC: 33.2 g/dL (ref 30.0–36.0)
MCV: 97 fL (ref 80.0–100.0)
Platelets: 152 10*3/uL (ref 150–400)
RBC: 4.32 MIL/uL (ref 4.22–5.81)
RDW: 14.3 % (ref 11.5–15.5)
WBC: 7 10*3/uL (ref 4.0–10.5)
nRBC: 0 % (ref 0.0–0.2)

## 2019-05-20 LAB — SURGICAL PCR SCREEN
MRSA, PCR: NEGATIVE
Staphylococcus aureus: POSITIVE — AB

## 2019-05-20 LAB — COMPREHENSIVE METABOLIC PANEL
ALT: 31 U/L (ref 0–44)
AST: 27 U/L (ref 15–41)
Albumin: 4.5 g/dL (ref 3.5–5.0)
Alkaline Phosphatase: 80 U/L (ref 38–126)
Anion gap: 9 (ref 5–15)
BUN: 12 mg/dL (ref 8–23)
CO2: 24 mmol/L (ref 22–32)
Calcium: 9.2 mg/dL (ref 8.9–10.3)
Chloride: 106 mmol/L (ref 98–111)
Creatinine, Ser: 0.8 mg/dL (ref 0.61–1.24)
GFR calc Af Amer: >60 mL/min (ref >60)
GFR calc non Af Amer: >60 mL/min (ref >60)
Glucose, Bld: 102 mg/dL — ABNORMAL HIGH (ref 70–99)
Potassium: 4.8 mmol/L (ref 3.5–5.1)
Sodium: 139 mmol/L (ref 135–145)
Total Bilirubin: 0.8 mg/dL (ref 0.3–1.2)
Total Protein: 7.1 g/dL (ref 6.5–8.1)

## 2019-05-20 LAB — PROTIME-INR
INR: 1 (ref 0.8–1.2)
Prothrombin Time: 12.9 seconds (ref 11.4–15.2)

## 2019-05-20 LAB — APTT: aPTT: 34 seconds (ref 24–36)

## 2019-05-20 NOTE — Progress Notes (Signed)
PCP - Dr. Lujean Amel  Cardiologist - N/A  Chest x-ray - 01/12/2019 in epic EKG - 01/12/2019 in chart Stress Test - N/A ECHO - N/A Cardiac Cath - N/A  Sleep Study - N/A CPAP - N/A  Fasting Blood Sugar - N/A Checks Blood Sugar _N/A____ times a day  Blood Thinner Instructions:  N/A Aspirin Instructions:  N/A Last Dose:  N/A  Anesthesia review:  N/A  Patient denies shortness of breath, fever, cough and chest pain at PAT appointment   Patient verbalized understanding of instructions that were given to them at the PAT appointment. Patient was also instructed that they will need to review over the PAT instructions again at home before surgery.

## 2019-05-20 NOTE — Progress Notes (Signed)
SPOKE W/  Harkirat     SCREENING SYMPTOMS OF COVID 19:   COUGH--NO  RUNNY NOSE--- NO  SORE THROAT---NO  NASAL CONGESTION----NO  SNEEZING----NO  SHORTNESS OF BREATH---NO  DIFFICULTY BREATHING---NO  TEMP >100.0 -----NO  UNEXPLAINED BODY ACHES------NO  CHILLS -------- NO  HEADACHES ---------NO  LOSS OF SMELL/ TASTE --------NO    HAVE YOU OR ANY FAMILY MEMBER TRAVELLED PAST 14 DAYS OUT OF THE   COUNTY[place]NO COUNTRY----NO  HAVE YOU OR ANY FAMILY MEMBER BEEN EXPOSED TO ANYONE WITH COVID 19? NO

## 2019-05-20 NOTE — H&P (Signed)
TOTAL KNEE ADMISSION H&P  Patient is being admitted for right total knee arthroplasty.  Subjective:  Chief Complaint:right knee pain.  HPI: Christopher Red., 73 y.o. male, has a history of pain and functional disability in the right knee due to arthritis and has failed non-surgical conservative treatments for greater than 12 weeks to includeNSAID's and/or analgesics, corticosteriod injections, flexibility and strengthening excercises and activity modification.  Onset of symptoms was gradual, starting 5 years ago with gradually worsening course since that time. The patient noted no past surgery on the right knee(s).  Patient currently rates pain in the right knee(s) at 8 out of 10 with activity. Patient has night pain, worsening of pain with activity and weight bearing, pain that interferes with activities of daily living, pain with passive range of motion, crepitus and joint swelling.  Patient has evidence of periarticular osteophytes and joint space narrowing by imaging studies.There is no active infection.  Patient Active Problem List   Diagnosis Date Noted  . OA (osteoarthritis) of knee 02/08/2019  . OA (osteoarthritis) of hip 06/10/2016  . Pain in right hip 10/26/2015   Past Medical History:  Diagnosis Date  . Arthritis    Arthritis knees- rt. hip  . Diverticulosis   . Hyperlipidemia   . Hypertension    tends to elevate in MD office.    Past Surgical History:  Procedure Laterality Date  . COLONOSCOPY    . JOINT REPLACEMENT Right 2017  . KNEE ARTHROSCOPY     bilat  . TOTAL HIP ARTHROPLASTY Right 06/10/2016   Procedure: RIGHT TOTAL HIP ARTHROPLASTY ANTERIOR APPROACH;  Surgeon: Gaynelle Arabian, MD;  Location: [facility];  Service: Orthopedics;  Laterality: Right;  . TOTAL KNEE ARTHROPLASTY Left 02/08/2019   Procedure: TOTAL KNEE ARTHROPLASTY;  Surgeon: Gaynelle Arabian, MD;  Location: [facility];  Service: Orthopedics;  Laterality: Left;  58min       Current Outpatient Medications   Medication Sig Dispense Refill Last Dose  . amLODipine (NORVASC) 2.5 MG tablet Take 2.5 mg by mouth daily.     Marland Kitchen atorvastatin (LIPITOR) 10 MG tablet Take 10 mg by mouth daily.      No Known Allergies  Social History   Tobacco Use  . Smoking status: Never Smoker  . Smokeless tobacco: Never Used  Substance Use Topics  . Alcohol use: Yes    Alcohol/week: 8.0 standard drinks    Types: 8 Cans of beer per week    Family History  Problem Relation Age of Onset  . Colon cancer Neg Hx   . Pancreatic cancer Neg Hx   . Stomach cancer Neg Hx   . Esophageal cancer Neg Hx      Review of Systems  Constitutional: Negative.   HENT: Negative.   Eyes: Negative.   Respiratory: Negative.   Cardiovascular: Negative.   Gastrointestinal: Negative.   Genitourinary: Negative.   Musculoskeletal: Positive for joint pain and myalgias. Negative for back pain, falls and neck pain.  Skin: Negative.   Neurological: Negative.   Endo/Heme/Allergies: Negative.   Psychiatric/Behavioral: Negative.     Objective:  Physical Exam  Constitutional: He is oriented to person, place, and time. He appears well-developed. No distress.  Obese  HENT:  Head: Normocephalic and atraumatic.  Right Ear: External ear normal.  Left Ear: External ear normal.  Nose: Nose normal.  Mouth/Throat: Oropharynx is clear and moist.  Eyes: Conjunctivae and EOM are normal.  Neck: Normal range of motion. Neck supple.  Cardiovascular: Normal rate, regular rhythm,  normal heart sounds and intact distal pulses.  No murmur heard. Respiratory: Effort normal and breath sounds normal. No respiratory distress. He has no wheezes.  GI: Bowel sounds are normal. He exhibits no distension. There is no abdominal tenderness.  Musculoskeletal:     Comments: Musculoskeletal: Right Knee Exam: Neutral alignment. No effusion. No Swelling. Range of motion is 5-125 degrees. No crepitus on range of motion of the knee. Positive medial more than  lateral joint line tenderness. Stable knee.  Left Knee Exam: AROM left knee 0-120 degrees. PROM left knee 0-125 degrees. Incision healed with no signs of infection. No erythema or effusion. No instability.  Neurological: He is alert and oriented to person, place, and time. He has normal strength. No sensory deficit.  Skin: No rash noted. He is not diaphoretic. No erythema.  Psychiatric: He has a normal mood and affect. His behavior is normal.    Vital signs in last 24 hours: Temp:  [97.3 F (36.3 C)] 97.3 F (36.3 C) (10/08 0938) Pulse Rate:  [80] 80 (10/08 0938) Resp:  [16] 16 (10/08 0938) BP: (162-176)/(82-89) 162/89 (10/08 1002) SpO2:  [99 %] 99 % (10/08 0938) Weight:  [114.3 kg] 114.3 kg (10/08 0938)  Labs:   Estimated body mass index is 35.15 kg/m as calculated from the following:   Height as of 05/20/19: 5\' 11"  (1.803 m).   Weight as of 05/20/19: 114.3 kg.   Imaging Review Plain radiographs demonstrate severe degenerative joint disease of the right knee(s). The overall alignment ismild varus. The bone quality appears to be good for age and reported activity level.   Assessment/Plan:  End stage primary osteoarthritis, right knee   The patient history, physical examination, clinical judgment of the provider and imaging studies are consistent with end stage degenerative joint disease of the right knee(s) and total knee arthroplasty is deemed medically necessary. The treatment options including medical management, injection therapy arthroscopy and arthroplasty were discussed at length. The risks and benefits of total knee arthroplasty were presented and reviewed. The risks due to aseptic loosening, infection, stiffness, patella tracking problems, thromboembolic complications and other imponderables were discussed. The patient acknowledged the explanation, agreed to proceed with the plan and consent was signed. Patient is being admitted for inpatient treatment for surgery, pain  control, PT, OT, prophylactic antibiotics, VTE prophylaxis, progressive ambulation and ADL's and discharge planning. The patient is planning to be discharged home.    Anticipated LOS equal to or greater than 2 midnights due to - Age 66 and older with one or more of the following:  - Obesity  - Expected need for hospital services (PT, OT, Nursing) required for safe  discharge  - Anticipated need for postoperative skilled nursing care or inpatient rehab  - Active co-morbidities: None OR   - Unanticipated findings during/Post Surgery: None  - Patient is a high risk of re-admission due to: None    Risks and benefits of the surgery were discussed with the patient and Dr. Wynelle Link at their previous office visit, and the patient has elected to move forward with the aforementioned surgery. Post-operative care plans were discussed with the patient today and all patient questions were answered.  Therapy Plans: outpatient therapy at [facility] on 10/16 Disposition: Home with wife Planned DVT Prophylaxis: aspirin 325mg  BID DME needed: none PCP: Dr. Dorthy Cooler TXA: IV Allergies: NKDA Anesthesia Concerns: none BMI: 33.5  Instructed patient on which medications to discontinue 5 days prior to surgery. Will follow-up in office with Dr. Wynelle Link 2  weeks post-op.  Ardeen Jourdain, PA-C

## 2019-05-21 LAB — NOVEL CORONAVIRUS, NAA (HOSP ORDER, SEND-OUT TO REF LAB; TAT 18-24 HRS): SARS-CoV-2, NAA: NOT DETECTED

## 2019-05-23 MED ORDER — BUPIVACAINE LIPOSOME 1.3 % IJ SUSP
20.0000 mL | Freq: Once | INTRAMUSCULAR | Status: DC
  Filled 2019-05-23: qty 20

## 2019-05-24 ENCOUNTER — Encounter (HOSPITAL_COMMUNITY): Payer: Self-pay | Admitting: *Deleted

## 2019-05-24 ENCOUNTER — Other Ambulatory Visit: Payer: Self-pay

## 2019-05-24 ENCOUNTER — Inpatient Hospital Stay (HOSPITAL_COMMUNITY): Payer: Medicare Other | Admitting: Anesthesiology

## 2019-05-24 ENCOUNTER — Observation Stay (HOSPITAL_COMMUNITY)
Admission: RE | Admit: 2019-05-24 | Discharge: 2019-05-25 | Disposition: A | Discharge status: Home / Self Care | Payer: Medicare Other | Attending: Orthopedic Surgery | Admitting: Orthopedic Surgery

## 2019-05-24 ENCOUNTER — Inpatient Hospital Stay (HOSPITAL_COMMUNITY): Payer: Medicare Other | Admitting: Physician Assistant

## 2019-05-24 ENCOUNTER — Encounter (HOSPITAL_COMMUNITY)
Admission: RE | Disposition: A | Discharge status: Home / Self Care | Payer: Self-pay | Source: Home / Self Care | Attending: Orthopedic Surgery

## 2019-05-24 DIAGNOSIS — Z96641 Presence of right artificial hip joint: Secondary | ICD-10-CM | POA: Diagnosis not present

## 2019-05-24 DIAGNOSIS — Z6835 Body mass index (BMI) 35.0-35.9, adult: Secondary | ICD-10-CM | POA: Diagnosis not present

## 2019-05-24 DIAGNOSIS — M1711 Unilateral primary osteoarthritis, right knee: Principal | ICD-10-CM | POA: Insufficient documentation

## 2019-05-24 DIAGNOSIS — Z79899 Other long term (current) drug therapy: Secondary | ICD-10-CM | POA: Insufficient documentation

## 2019-05-24 DIAGNOSIS — E785 Hyperlipidemia, unspecified: Secondary | ICD-10-CM | POA: Insufficient documentation

## 2019-05-24 DIAGNOSIS — I10 Essential (primary) hypertension: Secondary | ICD-10-CM | POA: Insufficient documentation

## 2019-05-24 DIAGNOSIS — E669 Obesity, unspecified: Secondary | ICD-10-CM | POA: Insufficient documentation

## 2019-05-24 DIAGNOSIS — M1611 Unilateral primary osteoarthritis, right hip: Secondary | ICD-10-CM | POA: Insufficient documentation

## 2019-05-24 DIAGNOSIS — M179 Osteoarthritis of knee, unspecified: Secondary | ICD-10-CM | POA: Diagnosis present

## 2019-05-24 DIAGNOSIS — Z96652 Presence of left artificial knee joint: Secondary | ICD-10-CM | POA: Diagnosis not present

## 2019-05-24 DIAGNOSIS — G8918 Other acute postprocedural pain: Secondary | ICD-10-CM | POA: Diagnosis not present

## 2019-05-24 DIAGNOSIS — M1710 Unilateral primary osteoarthritis, unspecified knee: Secondary | ICD-10-CM | POA: Diagnosis present

## 2019-05-24 HISTORY — PX: TOTAL KNEE ARTHROPLASTY: SHX125

## 2019-05-24 LAB — TYPE AND SCREEN
ABO/RH(D): O NEG
Antibody Screen: NEGATIVE

## 2019-05-24 SURGERY — ARTHROPLASTY, KNEE, TOTAL
Anesthesia: Regional | Site: Knee | Laterality: Right

## 2019-05-24 MED ORDER — SODIUM CHLORIDE (PF) 0.9 % IJ SOLN
INTRAMUSCULAR | Status: AC
  Filled 2019-05-24: qty 10

## 2019-05-24 MED ORDER — AMLODIPINE BESYLATE 5 MG PO TABS
2.5000 mg | ORAL_TABLET | Freq: Every day | ORAL | Status: DC
  Administered 2019-05-25: 10:00:00 2.5 mg via ORAL
  Filled 2019-05-24: qty 1

## 2019-05-24 MED ORDER — ACETAMINOPHEN 500 MG PO TABS
1000.0000 mg | ORAL_TABLET | Freq: Four times a day (QID) | ORAL | Status: AC
  Administered 2019-05-24 – 2019-05-25 (×4): 1000 mg via ORAL
  Filled 2019-05-24 (×3): qty 2

## 2019-05-24 MED ORDER — POLYETHYLENE GLYCOL 3350 17 G PO PACK: 17.0000 g | PACK | Freq: Every day | ORAL | Status: DC | PRN

## 2019-05-24 MED ORDER — ROPIVACAINE HCL 5 MG/ML IJ SOLN
INTRAMUSCULAR | Status: DC | PRN
  Administered 2019-05-24: 20 mL via PERINEURAL

## 2019-05-24 MED ORDER — ONDANSETRON HCL 4 MG PO TABS: 4.0000 mg | ORAL_TABLET | Freq: Four times a day (QID) | ORAL | Status: DC | PRN

## 2019-05-24 MED ORDER — DOCUSATE SODIUM 100 MG PO CAPS
100.0000 mg | ORAL_CAPSULE | Freq: Two times a day (BID) | ORAL | Status: DC
  Administered 2019-05-24 – 2019-05-25 (×2): 100 mg via ORAL
  Filled 2019-05-24 (×2): qty 1

## 2019-05-24 MED ORDER — FLEET ENEMA 7-19 GM/118ML RE ENEM: 1.0000 | ENEMA | Freq: Once | RECTAL | Status: DC | PRN

## 2019-05-24 MED ORDER — SODIUM CHLORIDE (PF) 0.9 % IJ SOLN
INTRAMUSCULAR | Status: AC
  Filled 2019-05-24: qty 50

## 2019-05-24 MED ORDER — ACETAMINOPHEN 10 MG/ML IV SOLN
1000.0000 mg | Freq: Four times a day (QID) | INTRAVENOUS | Status: DC
  Administered 2019-05-24: 1000 mg via INTRAVENOUS
  Filled 2019-05-24: qty 100

## 2019-05-24 MED ORDER — TRANEXAMIC ACID-NACL 1000-0.7 MG/100ML-% IV SOLN
1000.0000 mg | INTRAVENOUS | Status: AC
  Administered 2019-05-24: 1000 mg via INTRAVENOUS
  Filled 2019-05-24: qty 100

## 2019-05-24 MED ORDER — SODIUM CHLORIDE 0.9 % IV SOLN
INTRAVENOUS | Status: DC
  Administered 2019-05-24: 15:00:00 via INTRAVENOUS

## 2019-05-24 MED ORDER — PHENOL 1.4 % MT LIQD: 1.0000 | OROMUCOSAL | Status: DC | PRN

## 2019-05-24 MED ORDER — POVIDONE-IODINE 10 % EX SWAB
2.0000 "application " | Freq: Once | CUTANEOUS | Status: AC
  Administered 2019-05-24: 2 via TOPICAL

## 2019-05-24 MED ORDER — SODIUM CHLORIDE 0.9 % IR SOLN
Status: DC | PRN
  Administered 2019-05-24: 1000 mL

## 2019-05-24 MED ORDER — MORPHINE SULFATE (PF) 2 MG/ML IV SOLN: 1.0000 mg | INTRAVENOUS | Status: DC | PRN

## 2019-05-24 MED ORDER — FENTANYL CITRATE (PF) 100 MCG/2ML IJ SOLN: 25.0000 ug | INTRAMUSCULAR | Status: DC | PRN

## 2019-05-24 MED ORDER — BISACODYL 10 MG RE SUPP: 10.0000 mg | Freq: Every day | RECTAL | Status: DC | PRN

## 2019-05-24 MED ORDER — SODIUM CHLORIDE (PF) 0.9 % IJ SOLN
INTRAMUSCULAR | Status: DC | PRN
  Administered 2019-05-24: 60 mL

## 2019-05-24 MED ORDER — ONDANSETRON HCL 4 MG/2ML IJ SOLN
INTRAMUSCULAR | Status: DC | PRN
  Administered 2019-05-24: 4 mg via INTRAVENOUS

## 2019-05-24 MED ORDER — DEXAMETHASONE SODIUM PHOSPHATE 10 MG/ML IJ SOLN
10.0000 mg | Freq: Once | INTRAMUSCULAR | Status: AC
  Administered 2019-05-25: 10:00:00 10 mg via INTRAVENOUS
  Filled 2019-05-24: qty 1

## 2019-05-24 MED ORDER — METHOCARBAMOL 500 MG PO TABS: 500.0000 mg | ORAL_TABLET | Freq: Four times a day (QID) | ORAL | Status: DC | PRN

## 2019-05-24 MED ORDER — METOCLOPRAMIDE HCL 5 MG PO TABS: 5.0000 mg | ORAL_TABLET | Freq: Three times a day (TID) | ORAL | Status: DC | PRN

## 2019-05-24 MED ORDER — ONDANSETRON HCL 4 MG/2ML IJ SOLN
INTRAMUSCULAR | Status: AC
  Filled 2019-05-24: qty 2

## 2019-05-24 MED ORDER — BUPIVACAINE LIPOSOME 1.3 % IJ SUSP
INTRAMUSCULAR | Status: DC | PRN
  Administered 2019-05-24: 20 mL

## 2019-05-24 MED ORDER — FENTANYL CITRATE (PF) 100 MCG/2ML IJ SOLN
INTRAMUSCULAR | Status: DC | PRN
  Administered 2019-05-24 (×5): 50 ug via INTRAVENOUS

## 2019-05-24 MED ORDER — CHLORHEXIDINE GLUCONATE 4 % EX LIQD: 60.0000 mL | Freq: Once | CUTANEOUS | Status: DC

## 2019-05-24 MED ORDER — PROPOFOL 500 MG/50ML IV EMUL
INTRAVENOUS | Status: DC | PRN
  Administered 2019-05-24: 20 mg via INTRAVENOUS
  Administered 2019-05-24: 200 mg via INTRAVENOUS
  Administered 2019-05-24 (×5): 20 mg via INTRAVENOUS

## 2019-05-24 MED ORDER — LACTATED RINGERS IV SOLN
INTRAVENOUS | Status: DC
  Administered 2019-05-24 (×2): via INTRAVENOUS

## 2019-05-24 MED ORDER — HYDROMORPHONE HCL 2 MG/ML IJ SOLN
INTRAMUSCULAR | Status: AC
  Filled 2019-05-24: qty 1

## 2019-05-24 MED ORDER — ATORVASTATIN CALCIUM 10 MG PO TABS
10.0000 mg | ORAL_TABLET | Freq: Every day | ORAL | Status: DC
  Administered 2019-05-25: 10 mg via ORAL
  Filled 2019-05-24: qty 1

## 2019-05-24 MED ORDER — METHOCARBAMOL 500 MG IVPB - SIMPLE MED
500.0000 mg | Freq: Four times a day (QID) | INTRAVENOUS | Status: DC | PRN
  Filled 2019-05-24: qty 50

## 2019-05-24 MED ORDER — HYDROMORPHONE HCL 1 MG/ML IJ SOLN
INTRAMUSCULAR | Status: DC | PRN
  Administered 2019-05-24 (×4): 0.5 mg via INTRAVENOUS

## 2019-05-24 MED ORDER — PROPOFOL 10 MG/ML IV BOLUS
INTRAVENOUS | Status: AC
  Filled 2019-05-24: qty 40

## 2019-05-24 MED ORDER — ASPIRIN EC 325 MG PO TBEC
325.0000 mg | DELAYED_RELEASE_TABLET | Freq: Two times a day (BID) | ORAL | Status: DC
  Administered 2019-05-25: 10:00:00 325 mg via ORAL
  Filled 2019-05-24: qty 1

## 2019-05-24 MED ORDER — FENTANYL CITRATE (PF) 250 MCG/5ML IJ SOLN
INTRAMUSCULAR | Status: AC
  Filled 2019-05-24: qty 5

## 2019-05-24 MED ORDER — GABAPENTIN 300 MG PO CAPS
300.0000 mg | ORAL_CAPSULE | Freq: Three times a day (TID) | ORAL | Status: DC
  Administered 2019-05-24 – 2019-05-25 (×3): 300 mg via ORAL
  Filled 2019-05-24 (×3): qty 1

## 2019-05-24 MED ORDER — MIDAZOLAM HCL 2 MG/2ML IJ SOLN
INTRAMUSCULAR | Status: AC
  Administered 2019-05-24: 10:00:00 1 mg via INTRAVENOUS
  Filled 2019-05-24: qty 2

## 2019-05-24 MED ORDER — MIDAZOLAM HCL 2 MG/2ML IJ SOLN
1.0000 mg | INTRAMUSCULAR | Status: AC
  Administered 2019-05-24: 10:00:00 1 mg via INTRAVENOUS

## 2019-05-24 MED ORDER — OXYCODONE HCL 5 MG PO TABS
5.0000 mg | ORAL_TABLET | ORAL | Status: DC | PRN
  Filled 2019-05-24: qty 1

## 2019-05-24 MED ORDER — FENTANYL CITRATE (PF) 100 MCG/2ML IJ SOLN
50.0000 ug | INTRAMUSCULAR | Status: AC
  Administered 2019-05-24: 10:00:00 50 ug via INTRAVENOUS

## 2019-05-24 MED ORDER — CEFAZOLIN SODIUM-DEXTROSE 2-4 GM/100ML-% IV SOLN
2.0000 g | INTRAVENOUS | Status: AC
  Administered 2019-05-24: 11:00:00 2 g via INTRAVENOUS
  Filled 2019-05-24: qty 100

## 2019-05-24 MED ORDER — CEFAZOLIN SODIUM-DEXTROSE 2-4 GM/100ML-% IV SOLN
2.0000 g | Freq: Four times a day (QID) | INTRAVENOUS | Status: AC
  Administered 2019-05-24 (×2): 2 g via INTRAVENOUS
  Filled 2019-05-24 (×2): qty 100

## 2019-05-24 MED ORDER — MENTHOL 3 MG MT LOZG: 1.0000 | LOZENGE | OROMUCOSAL | Status: DC | PRN

## 2019-05-24 MED ORDER — DEXAMETHASONE SODIUM PHOSPHATE 4 MG/ML IJ SOLN
INTRAMUSCULAR | Status: DC | PRN
  Administered 2019-05-24: 4 mg via PERINEURAL

## 2019-05-24 MED ORDER — ONDANSETRON HCL 4 MG/2ML IJ SOLN: 4.0000 mg | Freq: Four times a day (QID) | INTRAMUSCULAR | Status: DC | PRN

## 2019-05-24 MED ORDER — DIPHENHYDRAMINE HCL 12.5 MG/5ML PO ELIX: 12.5000 mg | ORAL_SOLUTION | ORAL | Status: DC | PRN

## 2019-05-24 MED ORDER — METOCLOPRAMIDE HCL 5 MG/ML IJ SOLN: 5.0000 mg | Freq: Three times a day (TID) | INTRAMUSCULAR | Status: DC | PRN

## 2019-05-24 MED ORDER — DEXAMETHASONE SODIUM PHOSPHATE 10 MG/ML IJ SOLN
8.0000 mg | Freq: Once | INTRAMUSCULAR | Status: AC
  Administered 2019-05-24: 11:00:00 8 mg via INTRAVENOUS

## 2019-05-24 MED ORDER — OXYCODONE HCL 5 MG PO TABS: 10.0000 mg | ORAL_TABLET | ORAL | Status: DC | PRN

## 2019-05-24 MED ORDER — TRAMADOL HCL 50 MG PO TABS
50.0000 mg | ORAL_TABLET | Freq: Four times a day (QID) | ORAL | Status: DC | PRN
  Administered 2019-05-24 – 2019-05-25 (×4): 50 mg via ORAL
  Filled 2019-05-24 (×4): qty 1

## 2019-05-24 MED ORDER — DEXAMETHASONE SODIUM PHOSPHATE 10 MG/ML IJ SOLN
INTRAMUSCULAR | Status: AC
  Filled 2019-05-24: qty 1

## 2019-05-24 MED ORDER — FENTANYL CITRATE (PF) 100 MCG/2ML IJ SOLN
INTRAMUSCULAR | Status: AC
  Administered 2019-05-24: 10:00:00 50 ug via INTRAVENOUS
  Filled 2019-05-24: qty 2

## 2019-05-24 SURGICAL SUPPLY — 59 items
ATTUNE MED DOME PAT 41 KNEE (Knees) ×1 IMPLANT
ATTUNE MED DOME PAT 41MM KNEE (Knees) ×1 IMPLANT
ATTUNE PS FEM RT SZ 7 CEM KNEE (Femur) ×2 IMPLANT
ATTUNE PSRP INSR SZ7 10 KNEE (Insert) ×1 IMPLANT
ATTUNE PSRP INSR SZ7 10MM KNEE (Insert) ×1 IMPLANT
BAG SPEC THK2 15X12 ZIP CLS (MISCELLANEOUS)
BAG ZIPLOCK 12X15 (MISCELLANEOUS) IMPLANT
BASE TIBIAL ROT PLAT SZ 7 KNEE (Knees) IMPLANT
BLADE SAG 18X100X1.27 (BLADE) ×3 IMPLANT
BLADE SAW SGTL 11.0X1.19X90.0M (BLADE) ×3 IMPLANT
BNDG ELASTIC 6X5.8 VLCR STR LF (GAUZE/BANDAGES/DRESSINGS) ×3 IMPLANT
BOWL SMART MIX CTS (DISPOSABLE) ×3 IMPLANT
BSPLAT TIB 7 CMNT ROT PLAT STR (Knees) ×1 IMPLANT
CEMENT HV SMART SET (Cement) ×6 IMPLANT
CLOSURE WOUND 1/2 X4 (GAUZE/BANDAGES/DRESSINGS) ×2
COVER SURGICAL LIGHT HANDLE (MISCELLANEOUS) ×3 IMPLANT
COVER WAND RF STERILE (DRAPES) IMPLANT
CUFF TOURN SGL QUICK 34 (TOURNIQUET CUFF) ×3
CUFF TRNQT CYL 34X4.125X (TOURNIQUET CUFF) ×1 IMPLANT
DECANTER SPIKE VIAL GLASS SM (MISCELLANEOUS) ×5 IMPLANT
DRAPE U-SHAPE 47X51 STRL (DRAPES) ×3 IMPLANT
DRSG ADAPTIC 3X8 NADH LF (GAUZE/BANDAGES/DRESSINGS) ×3 IMPLANT
DRSG PAD ABDOMINAL 8X10 ST (GAUZE/BANDAGES/DRESSINGS) ×3 IMPLANT
DURAPREP 26ML APPLICATOR (WOUND CARE) ×3 IMPLANT
ELECT REM PT RETURN 15FT ADLT (MISCELLANEOUS) ×3 IMPLANT
EVACUATOR 1/8 PVC DRAIN (DRAIN) ×3 IMPLANT
GAUZE SPONGE 4X4 12PLY STRL (GAUZE/BANDAGES/DRESSINGS) ×3 IMPLANT
GLOVE BIO SURGEON STRL SZ8 (GLOVE) ×3 IMPLANT
GLOVE BIOGEL M STRL SZ7.5 (GLOVE) ×2 IMPLANT
GLOVE BIOGEL PI IND STRL 7.5 (GLOVE) IMPLANT
GLOVE BIOGEL PI IND STRL 8 (GLOVE) ×1 IMPLANT
GLOVE BIOGEL PI INDICATOR 7.5 (GLOVE) ×2
GLOVE BIOGEL PI INDICATOR 8 (GLOVE) ×2
GOWN STRL REUS W/TWL LRG LVL3 (GOWN DISPOSABLE) ×5 IMPLANT
GOWN STRL REUS W/TWL XL LVL3 (GOWN DISPOSABLE) ×2 IMPLANT
HANDPIECE INTERPULSE COAX TIP (DISPOSABLE) ×3
HOLDER FOLEY CATH W/STRAP (MISCELLANEOUS) IMPLANT
IMMOBILIZER KNEE 20 (SOFTGOODS) ×3
IMMOBILIZER KNEE 20 THIGH 36 (SOFTGOODS) ×1 IMPLANT
KIT TURNOVER KIT A (KITS) IMPLANT
MANIFOLD NEPTUNE II (INSTRUMENTS) ×3 IMPLANT
NS IRRIG 1000ML POUR BTL (IV SOLUTION) ×3 IMPLANT
PACK TOTAL KNEE CUSTOM (KITS) ×3 IMPLANT
PADDING CAST COTTON 6X4 STRL (CAST SUPPLIES) ×9 IMPLANT
PIN DRILL FIX HALF THREAD (BIT) ×2 IMPLANT
PIN STEINMAN FIXATION KNEE (PIN) ×2 IMPLANT
PROTECTOR NERVE ULNAR (MISCELLANEOUS) ×3 IMPLANT
SET HNDPC FAN SPRY TIP SCT (DISPOSABLE) ×1 IMPLANT
STRIP CLOSURE SKIN 1/2X4 (GAUZE/BANDAGES/DRESSINGS) ×4 IMPLANT
SUT MNCRL AB 4-0 PS2 18 (SUTURE) ×3 IMPLANT
SUT STRATAFIX 0 PDS 27 VIOLET (SUTURE) ×3
SUT VIC AB 2-0 CT1 27 (SUTURE) ×9
SUT VIC AB 2-0 CT1 TAPERPNT 27 (SUTURE) ×3 IMPLANT
SUTURE STRATFX 0 PDS 27 VIOLET (SUTURE) ×1 IMPLANT
TIBIAL BASE ROT PLAT SZ 7 KNEE (Knees) ×3 IMPLANT
TRAY FOLEY MTR SLVR 16FR STAT (SET/KITS/TRAYS/PACK) ×1 IMPLANT
WATER STERILE IRR 1000ML POUR (IV SOLUTION) ×6 IMPLANT
WRAP KNEE MAXI GEL POST OP (GAUZE/BANDAGES/DRESSINGS) ×3 IMPLANT
YANKAUER SUCT BULB TIP 10FT TU (MISCELLANEOUS) ×3 IMPLANT

## 2019-05-24 NOTE — Transfer of Care (Signed)
Immediate Anesthesia Transfer of Care Note  Patient: Christopher Gibbs.  Procedure(s) Performed: TOTAL KNEE ARTHROPLASTY (Right Knee)  Patient Location: PACU  Anesthesia Type:General  Level of Consciousness: awake, alert  and oriented  Airway & Oxygen Therapy: Patient Spontanous Breathing and Patient connected to face mask oxygen  Post-op Assessment: Report given to RN and Post -op Vital signs reviewed and stable  Post vital signs: Reviewed and stable  Last Vitals:  Vitals Value Taken Time  BP    Temp    Pulse    Resp    SpO2      Last Pain:  Vitals:   05/24/19 0940  TempSrc:   PainSc: 0-No pain      Patients Stated Pain Goal: 4 ([id number])  Complications: No apparent anesthesia complications

## 2019-05-24 NOTE — Anesthesia Procedure Notes (Signed)
Date/Time: 05/24/2019 10:13 AM Performed by: Glory Buff, CRNA Oxygen Delivery Method: Nasal cannula

## 2019-05-24 NOTE — Discharge Instructions (Addendum)
° °Dr. Frank [name] °Total Joint Specialist °[facility] °[address] °[place], [place] [address] °[phone] ° °TOTAL KNEE REPLACEMENT POSTOPERATIVE DIRECTIONS ° °Knee Rehabilitation, Guidelines Following Surgery  °Results after knee surgery are often greatly improved when you follow the exercise, range of motion and muscle strengthening exercises prescribed by your doctor. Safety measures are also important to protect the knee from further injury. Any time any of these exercises cause you to have increased pain or swelling in your knee joint, decrease the amount until you are comfortable again and slowly increase them. If you have problems or questions, call your caregiver or physical therapist for advice.  ° °HOME CARE INSTRUCTIONS  °Remove items at home which could result in a fall. This includes throw rugs or furniture in walking pathways.  °· ICE to the affected knee every three hours for 30 minutes at a time and then as needed for pain and swelling.  Continue to use ice on the knee for pain and swelling from surgery. You may notice swelling that will progress down to the foot and ankle.  This is normal after surgery.  Elevate the leg when you are not up walking on it.   °· Continue to use the breathing machine which will help keep your temperature down.  It is common for your temperature to cycle up and down following surgery, especially at night when you are not up moving around and exerting yourself.  The breathing machine keeps your lungs expanded and your temperature down. °· Do not place pillow under knee, focus on keeping the knee straight while resting ° °DIET °You may resume your previous home diet once your are discharged from the hospital. ° °DRESSING / WOUND CARE / SHOWERING °You may shower 3 days after surgery, but keep the wounds dry during showering.  You may use an occlusive plastic wrap (Press'n Seal for example), NO SOAKING/SUBMERGING IN THE BATHTUB.  If the bandage gets  wet, change with a clean dry gauze.  If the incision gets wet, pat the wound dry with a clean towel. °You may start showering once you are discharged home but do not submerge the incision under water. Just pat the incision dry and apply a dry gauze dressing on daily. °Change the surgical dressing daily and reapply a dry dressing each time. ° °ACTIVITY °Walk with your walker as instructed. °Use walker as long as suggested by your caregivers. °Avoid periods of inactivity such as sitting longer than an hour when not asleep. This helps prevent blood clots.  °You may resume a sexual relationship in one month or when given the OK by your doctor.  °You may return to work once you are cleared by your doctor.  °Do not drive a car for 6 weeks or until released by you surgeon.  °Do not drive while taking narcotics. ° °WEIGHT BEARING °Weight bearing as tolerated with assist device (walker, cane, etc) as directed, use it as long as suggested by your surgeon or therapist, typically at least 4-6 weeks. ° °POSTOPERATIVE CONSTIPATION PROTOCOL °Constipation - defined medically as fewer than three stools per week and severe constipation as less than one stool per week. ° °One of the most common issues patients have following surgery is constipation.  Even if you have a regular bowel pattern at home, your normal regimen is likely to be disrupted due to multiple reasons following surgery.  Combination of anesthesia, postoperative narcotics, change in appetite and fluid intake all can affect your bowels.    In order to avoid complications following surgery, here are some recommendations in order to help you during your recovery period. ° °Colace (docusate) - Pick up an over-the-counter form of Colace or another stool softener and take twice a day as long as you are requiring postoperative pain medications.  Take with a full glass of water daily.  If you experience loose stools or diarrhea, hold the colace until you stool forms back up.  If  your symptoms do not get better within 1 week or if they get worse, check with your doctor. ° °Dulcolax (bisacodyl) - Pick up over-the-counter and take as directed by the product packaging as needed to assist with the movement of your bowels.  Take with a full glass of water.  Use this product as needed if not relieved by Colace only.  ° °MiraLax (polyethylene glycol) - Pick up over-the-counter to have on hand.  MiraLax is a solution that will increase the amount of water in your bowels to assist with bowel movements.  Take as directed and can mix with a glass of water, juice, soda, coffee, or tea.  Take if you go more than two days without a movement. °Do not use MiraLax more than once per day. Call your doctor if you are still constipated or irregular after using this medication for 7 days in a row. ° °If you continue to have problems with postoperative constipation, please contact the office for further assistance and recommendations.  If you experience "the worst abdominal pain ever" or develop nausea or vomiting, please contact the office immediatly for further recommendations for treatment. ° °ITCHING ° If you experience itching with your medications, try taking only a single pain pill, or even half a pain pill at a time.  You can also use Benadryl over the counter for itching or also to help with sleep.  ° °TED HOSE STOCKINGS °Wear the elastic stockings on both legs for three weeks following surgery during the day but you may remove then at night for sleeping. ° °MEDICATIONS °See your medication summary on the “After Visit Summary” that the nursing staff will review with you prior to discharge.  You may have some home medications which will be placed on hold until you complete the course of blood thinner medication.  It is important for you to complete the blood thinner medication as prescribed by your surgeon.  Continue your approved medications as instructed at time of discharge. ° °Gabapentin 300 mg  Protocol °Take a 300 mg capsule three times a day for two weeks, °Then a 300 mg capsule twice a day for two weeks, °Then a 300 mg capsule once a day for two weeks, then discontinue the Gabapentin. ° °PRECAUTIONS °If you experience chest pain or shortness of breath - call 911 immediately for transfer to the hospital emergency department.  °If you develop a fever greater that 101 F, purulent drainage from wound, increased redness or drainage from wound, foul odor from the wound/dressing, or calf pain - CONTACT YOUR SURGEON.   °                                                °FOLLOW-UP APPOINTMENTS °Make sure you keep all of your appointments after your operation with your surgeon and caregivers. You should call the office at the above phone number and make an appointment for   approximately two weeks after the date of your surgery or on the date instructed by your surgeon outlined in the "After Visit Summary". ° ° °RANGE OF MOTION AND STRENGTHENING EXERCISES  °Rehabilitation of the knee is important following a knee injury or an operation. After just a few days of immobilization, the muscles of the thigh which control the knee become weakened and shrink (atrophy). Knee exercises are designed to build up the tone and strength of the thigh muscles and to improve knee motion. Often times heat used for twenty to thirty minutes before working out will loosen up your tissues and help with improving the range of motion but do not use heat for the first two weeks following surgery. These exercises can be done on a training (exercise) mat, on the floor, on a table or on a bed. Use what ever works the best and is most comfortable for you Knee exercises include:  °Leg Lifts - While your knee is still immobilized in a splint or cast, you can do straight leg raises. Lift the leg to 60 degrees, hold for 3 sec, and slowly lower the leg. Repeat 10-20 times 2-3 times daily. Perform this exercise against resistance later as your knee  gets better.  °Quad and Hamstring Sets - Tighten up the muscle on the front of the thigh (Quad) and hold for 5-10 sec. Repeat this 10-20 times hourly. Hamstring sets are done by pushing the foot backward against an object and holding for 5-10 sec. Repeat as with quad sets.  °· Leg Slides: Lying on your back, slowly slide your foot toward your buttocks, bending your knee up off the floor (only go as far as is comfortable). Then slowly slide your foot back down until your leg is flat on the floor again. °· Angel Wings: Lying on your back spread your legs to the side as far apart as you can without causing discomfort.  °A rehabilitation program following serious knee injuries can speed recovery and prevent re-injury in the future due to weakened muscles. Contact your doctor or a physical therapist for more information on knee rehabilitation.  ° °IF YOU ARE TRANSFERRED TO A SKILLED REHAB FACILITY °If the patient is transferred to a skilled rehab facility following release from the hospital, a list of the current medications will be sent to the facility for the patient to continue.  When discharged from the skilled rehab facility, please have the facility set up the patient's Home Health Physical Therapy prior to being released. Also, the skilled facility will be responsible for providing the patient with their medications at time of release from the facility to include their pain medication, the muscle relaxants, and their blood thinner medication. If the patient is still at the rehab facility at time of the two week follow up appointment, the skilled rehab facility will also need to assist the patient in arranging follow up appointment in our office and any transportation needs. ° °MAKE SURE YOU:  °Understand these instructions.  °Get help right away if you are not doing well or get worse.  ° ° °Pick up stool softner and laxative for home use following surgery while on pain medications. °Do not submerge incision under  water. °Please use good hand washing techniques while changing dressing each day. °May shower starting three days after surgery. °Please use a clean towel to pat the incision dry following showers. °Continue to use ice for pain and swelling after surgery. °Do not use any lotions or creams on the   incision until instructed by your surgeon. °

## 2019-05-24 NOTE — Anesthesia Procedure Notes (Signed)
Anesthesia Regional Block: Adductor canal block   Pre-Anesthetic Checklist: ,, timeout performed, Correct Patient, Correct Site, Correct Laterality, Correct Procedure, Correct Position, site marked, Risks and benefits discussed,  Surgical consent,  Pre-op evaluation,  At surgeon's request and post-op pain management  Laterality: Right  Prep: Maximum Sterile Barrier Precautions used, chloraprep       Needles:  Injection technique: Single-shot  Needle Type: Echogenic Stimulator Needle     Needle Length: 9cm  Needle Gauge: 22     Additional Needles:   Procedures:,,,, ultrasound used (permanent image in chart),,,,  Narrative:  Start time: 05/24/2019 9:27 AM End time: 05/24/2019 9:37 AM Injection made incrementally with aspirations every 5 mL.  Performed by: Personally  Anesthesiologist: Freddrick March, MD  Additional Notes: Monitors applied. No increased pain on injection. No increased resistance to injection. Injection made in 5cc increments. Good needle visualization. Patient tolerated procedure well.

## 2019-05-24 NOTE — Anesthesia Preprocedure Evaluation (Addendum)
Anesthesia Evaluation  Patient identified by MRN, date of birth, ID band Patient awake    Reviewed: Allergy & Precautions, NPO status , Patient's Chart, lab work & pertinent test results  Airway Mallampati: III  TM Distance: >3 FB Neck ROM: Full  Mouth opening: Limited Mouth Opening  Dental no notable dental hx. (+) Teeth Intact, Dental Advisory Given   Pulmonary neg pulmonary ROS,    Pulmonary exam normal breath sounds clear to auscultation       Cardiovascular hypertension, Normal cardiovascular exam Rhythm:Regular Rate:Normal  HLD   Neuro/Psych negative neurological ROS  negative psych ROS   GI/Hepatic negative GI ROS, Neg liver ROS,   Endo/Other  negative endocrine ROS  Renal/GU negative Renal ROS  negative genitourinary   Musculoskeletal  (+) Arthritis ,   Abdominal   Peds  Hematology negative hematology ROS (+)   Anesthesia Other Findings   Reproductive/Obstetrics                            Anesthesia Physical Anesthesia Plan  ASA: II  Anesthesia Plan: Spinal and Regional   Post-op Pain Management:  Regional for Post-op pain   Induction:   PONV Risk Score and Plan: Treatment may vary due to age or medical condition  Airway Management Planned: Natural Airway  Additional Equipment:   Intra-op Plan:   Post-operative Plan:   Informed Consent: I have reviewed the patients History and Physical, chart, labs and discussed the procedure including the risks, benefits and alternatives for the proposed anesthesia with the patient or authorized representative who has indicated his/her understanding and acceptance.     Dental advisory given  Plan Discussed with: CRNA  Anesthesia Plan Comments:         Anesthesia Quick Evaluation

## 2019-05-24 NOTE — Evaluation (Signed)
Physical Therapy Evaluation Patient Details Name: Christopher Gibbs. MRN: [id number] DOB: 07/19/1946 Today's Date: 05/24/2019   History of Present Illness  s/p R TKA; PMH: L TKA, R THA, HTN  Clinical Impression  Pt is s/p TKA resulting in the deficits listed below (see PT Problem List).   Pt amb 80' with RW and min assist; anticipate steady progress in acute setting, possible d/c tomorrow if doing well.   Pt will benefit from skilled PT to increase their independence and safety with mobility to allow discharge to the venue listed below.      Follow Up Recommendations Follow surgeon's recommendation for DC plan and follow-up therapies    Equipment Recommendations  None recommended by PT    Recommendations for Other Services       Precautions / Restrictions Precautions Precautions: Knee;Fall Required Braces or Orthoses: Knee Immobilizer - Right Knee Immobilizer - Right: Discontinue once straight leg raise with < 10 degree lag      Mobility  Bed Mobility Overal bed mobility: Needs Assistance Bed Mobility: Supine to Sit     Supine to sit: Min guard;HOB elevated     General bed mobility comments: for safety  Transfers Overall transfer level: Needs assistance Equipment used: Rolling walker (2 wheeled) Transfers: Sit to/from Stand Sit to Stand: Min assist         General transfer comment: cues for hand placement and RLE position  Ambulation/Gait Ambulation/Gait assistance: Min assist;Min guard Gait Distance (Feet): 80 Feet Assistive device: Rolling walker (2 wheeled) Gait Pattern/deviations: Step-to pattern;Decreased stance time - right     General Gait Details: cues for sequence and RW position from self  Stairs            Wheelchair Mobility    Modified Rankin (Stroke Patients Only)       Balance                                             Pertinent Vitals/Pain Pain Assessment: 0-10 Pain Score: 3  Pain Location: right  knee Pain Descriptors / Indicators: Aching;Grimacing;Sore Pain Intervention(s): Limited activity within patient's tolerance;Monitored during session;Premedicated before session;Repositioned;Ice applied    Home Living Family/patient expects to be discharged to:: Private residence Living Arrangements: Spouse/significant other Available Help at Discharge: Family Type of Home: House Home Access: Stairs to enter   Technical brewer of Steps: 2 Home Layout: Two level Home Equipment: Environmental consultant - 2 wheels;Cane - single point;Shower seat;Bedside commode Additional Comments: has bed  downstairs, plans to stay downstairs for  afew weeks    Prior Function Level of Independence: Independent               Hand Dominance        Extremity/Trunk Assessment   Upper Extremity Assessment Upper Extremity Assessment: Overall WFL for tasks assessed    Lower Extremity Assessment Lower Extremity Assessment: RLE deficits/detail RLE Deficits / Details: ankle WFL, knee and hip grossly 2+ to 3/5, limited by post op pain and weakness; AAROM right knee 6 to 60 degrees flexion       Communication   Communication: No difficulties  Cognition Arousal/Alertness: Awake/alert Behavior During Therapy: WFL for tasks assessed/performed Overall Cognitive Status: Within Functional Limits for tasks assessed  General Comments      Exercises Total Joint Exercises Ankle Circles/Pumps: AROM;15 reps;Both Quad Sets: AROM;5 reps;Both   Assessment/Plan    PT Assessment Patient needs continued PT services  PT Problem List Decreased strength;Decreased range of motion;Decreased activity tolerance;Decreased mobility;Pain;Decreased knowledge of use of DME       PT Treatment Interventions Therapeutic exercise;DME instruction;Gait training;Stair training;Functional mobility training;Therapeutic activities;Patient/family education    PT Goals (Current  goals can be found in the Care Plan section)  Acute Rehab PT Goals PT Goal Formulation: With patient Time For Goal Achievement: 05/31/19 Potential to Achieve Goals: Good    Frequency 7X/week   Barriers to discharge        Co-evaluation               AM-PAC PT "6 Clicks" Mobility  Outcome Measure Help needed turning from your back to your side while in a flat bed without using bedrails?: A Little Help needed moving from lying on your back to sitting on the side of a flat bed without using bedrails?: A Little Help needed moving to and from a bed to a chair (including a wheelchair)?: A Little Help needed standing up from a chair using your arms (e.g., wheelchair or bedside chair)?: A Little Help needed to walk in hospital room?: A Little Help needed climbing 3-5 steps with a railing? : A Lot 6 Click Score: 17    End of Session Equipment Utilized During Treatment: Gait belt;Right knee immobilizer Activity Tolerance: Patient tolerated treatment well Patient left: in chair;with call bell/phone within reach;with family/visitor present;with chair alarm set   PT Visit Diagnosis: Difficulty in walking, not elsewhere classified (R26.2)    Time: 1720-1746 PT Time Calculation (min) (ACUTE ONLY): 26 min   Charges:   PT Evaluation $PT Eval Low Complexity: 1 Low PT Treatments $Gait Training: 8-22 mins        Kenyon Ana, PT  Pager: [phone] Acute Rehab Dept [facility]): [phone]   05/24/2019   [facility] 05/24/2019, 6:26 PM

## 2019-05-24 NOTE — Op Note (Signed)
OPERATIVE REPORT-TOTAL KNEE ARTHROPLASTY   Pre-operative diagnosis- Osteoarthritis  Right knee(s)  Post-operative diagnosis- Osteoarthritis Right knee(s)  Procedure-  Right  Total Knee Arthroplasty  Surgeon- Christopher Plover. Jordin Vicencio, MD  Assistant- Molli Barrows, PA-C   Anesthesia-  GA combined with regional for post-op pain  EBL- 25 ml  Drains Hemovac  Tourniquet time-  Total Tourniquet Time Documented: Thigh (Right) - 51 minutes Total: Thigh (Right) - 51 minutes     Complications- None  Condition-PACU - hemodynamically stable.   Brief Clinical Note  Christopher Gibbs. is a 73 y.o. year old male with end stage OA of his right knee with progressively worsening pain and dysfunction. He has constant pain, with activity and at rest and significant functional deficits with difficulties even with ADLs. He has had extensive non-op management including analgesics, injections of cortisone and viscosupplements, and home exercise program, but remains in significant pain with significant dysfunction. Radiographs show bone on bone arthritis medial and patellofemoral. He presents now for right Total Knee Arthroplasty.    Procedure in detail---   The patient is brought into the operating room and positioned supine on the operating table. After successful administration of  GA combined with regional for post-op pain,   a tourniquet is placed high on the  Right thigh(s) and the lower extremity is prepped and draped in the usual sterile fashion. Time out is performed by the operating team and then the  Right lower extremity is wrapped in Esmarch, knee flexed and the tourniquet inflated to 300 mmHg.       A midline incision is made with a ten blade through the subcutaneous tissue to the level of the extensor mechanism. A fresh blade is used to make a medial parapatellar arthrotomy. Soft tissue over the proximal medial tibia is subperiosteally elevated to the joint line with a knife and into the  semimembranosus bursa with a Cobb elevator. Soft tissue over the proximal lateral tibia is elevated with attention being paid to avoiding the patellar tendon on the tibial tubercle. The patella is everted, knee flexed 90 degrees and the ACL and PCL are removed. Findings are bone on bone all 3 compartments with massive global osteophytes.        The drill is used to create a starting hole in the distal femur and the canal is thoroughly irrigated with sterile saline to remove the fatty contents. The 5 degree Right  valgus alignment guide is placed into the femoral canal and the distal femoral cutting block is pinned to remove 9 mm off the distal femur. Resection is made with an oscillating saw.      The tibia is subluxed forward and the menisci are removed. The extramedullary alignment guide is placed referencing proximally at the medial aspect of the tibial tubercle and distally along the second metatarsal axis and tibial crest. The block is pinned to remove 75mm off the more deficient medial  side. Resection is made with an oscillating saw. Size 7is the most appropriate size for the tibia and the proximal tibia is prepared with the modular drill and keel punch for that size.      The femoral sizing guide is placed and size 7 is most appropriate. Rotation is marked off the epicondylar axis and confirmed by creating a rectangular flexion gap at 90 degrees. The size 7 cutting block is pinned in this rotation and the anterior, posterior and chamfer cuts are made with the oscillating saw. The intercondylar block is then placed and  that cut is made.      Trial size 7 tibial component, trial size 7 posterior stabilized femur and a  10  mm posterior stabilized rotating platform insert trial is placed. Full extension is achieved with excellent varus/valgus and anterior/posterior balance throughout full range of motion. The patella is everted and thickness measured to be 27  mm. Free hand resection is taken to 15 mm, a 41  template is placed, lug holes are drilled, trial patella is placed, and it tracks normally. Osteophytes are removed off the posterior femur with the trial in place. All trials are removed and the cut bone surfaces prepared with pulsatile lavage. Cement is mixed and once ready for implantation, the size 7 tibial implant, size  7 posterior stabilized femoral component, and the size 41 patella are cemented in place and the patella is held with the clamp. The trial insert is placed and the knee held in full extension. The Exparel (20 ml mixed with 60 ml saline) is injected into the extensor mechanism, posterior capsule, medial and lateral gutters and subcutaneous tissues.  All extruded cement is removed and once the cement is hard the permanent 10 mm posterior stabilized rotating platform insert is placed into the tibial tray.      The wound is copiously irrigated with saline solution and the extensor mechanism closed over a hemovac drain with #1 V-loc suture. The tourniquet is released for a total tourniquet time of 52  minutes. Flexion against gravity is 140 degrees and the patella tracks normally. Subcutaneous tissue is closed with 2.0 vicryl and subcuticular with running 4.0 Monocryl. The incision is cleaned and dried and steri-strips and a bulky sterile dressing are applied. The limb is placed into a knee immobilizer and the patient is awakened and transported to recovery in stable condition.      Please note that a surgical assistant was a medical necessity for this procedure in order to perform it in a safe and expeditious manner. Surgical assistant was necessary to retract the ligaments and vital neurovascular structures to prevent injury to them and also necessary for proper positioning of the limb to allow for anatomic placement of the prosthesis.   Christopher Plover Alexei Ey, MD    05/24/2019, 12:04 PM

## 2019-05-24 NOTE — Progress Notes (Signed)
AssistedDr. Chelsey Woodrum with right, ultrasound guided, adductor canal block. Side rails up, monitors on throughout procedure. See vital signs in flow sheet. Tolerated Procedure well.  

## 2019-05-24 NOTE — Anesthesia Procedure Notes (Signed)
Procedure Name: LMA Insertion Date/Time: 05/24/2019 10:48 AM Performed by: Glory Buff, CRNA Pre-anesthesia Checklist: Patient identified, Emergency Drugs available, Patient being monitored and Suction available Patient Re-evaluated:Patient Re-evaluated prior to induction Oxygen Delivery Method: Circle system utilized Preoxygenation: Pre-oxygenation with 100% oxygen Induction Type: IV induction LMA: LMA inserted LMA Size: 5.0 Number of attempts: 1 Placement Confirmation: positive ETCO2 Tube secured with: Tape Dental Injury: Teeth and Oropharynx as per pre-operative assessment

## 2019-05-24 NOTE — Care Plan (Signed)
Ortho Bundle Case Management Note  Patient Details  Name: Christopher Gibbs. MRN: [id number] Date of Birth: January 17, 1946  R TKA on 05-24-19 DCP:  Home with spouse.  2 story home with 3 ste. DME:  No needs.  Has a RW and 3-in-1. PT:  [facility].  PT eval scheduled on 05-27-19.                   DME Arranged:  N/A DME Agency:  NA  HH Arranged:  NA HH Agency:  NA  Additional Comments: Please contact me with any questions of if this plan should need to change.  Marianne Sofia, RN,CCM [facility]  [phone] 05/24/2019, 4:17 PM

## 2019-05-24 NOTE — Interval H&P Note (Signed)
History and Physical Interval Note:  05/24/2019 8:25 AM  Christopher Gibbs.  has presented today for surgery, with the diagnosis of right knee ostearthritis.  The various methods of treatment have been discussed with the patient and family. After consideration of risks, benefits and other options for treatment, the patient has consented to  Procedure(s) with comments: TOTAL KNEE ARTHROPLASTY (Right) - 44min as a surgical intervention.  The patient's history has been reviewed, patient examined, no change in status, stable for surgery.  I have reviewed the patient's chart and labs.  Questions were answered to the patient's satisfaction.     Pilar Plate Arminta Gamm

## 2019-05-24 NOTE — Anesthesia Postprocedure Evaluation (Signed)
Anesthesia Post Note  Patient: Christopher Gibbs.  Procedure(s) Performed: TOTAL KNEE ARTHROPLASTY (Right Knee)     Patient location during evaluation: PACU Anesthesia Type: Regional and General Level of consciousness: awake and alert Pain management: pain level controlled Vital Signs Assessment: post-procedure vital signs reviewed and stable Respiratory status: spontaneous breathing, nonlabored ventilation, respiratory function stable and patient connected to nasal cannula oxygen Cardiovascular status: blood pressure returned to baseline and stable Postop Assessment: no apparent nausea or vomiting Anesthetic complications: no    Last Vitals:  Vitals:   05/24/19 1355 05/24/19 1508  BP: (!) 144/84 (!) 157/76  Pulse: 68 78  Resp: 16 15  Temp: 36.6 C 36.6 C  SpO2: 97% 98%    Last Pain:  Vitals:   05/24/19 1508  TempSrc: Oral  PainSc:                  Shandra Szymborski L Dae Antonucci

## 2019-05-25 ENCOUNTER — Encounter (HOSPITAL_COMMUNITY): Payer: Self-pay | Admitting: Orthopedic Surgery

## 2019-05-25 DIAGNOSIS — I10 Essential (primary) hypertension: Secondary | ICD-10-CM | POA: Diagnosis not present

## 2019-05-25 DIAGNOSIS — E669 Obesity, unspecified: Secondary | ICD-10-CM | POA: Diagnosis not present

## 2019-05-25 DIAGNOSIS — Z6835 Body mass index (BMI) 35.0-35.9, adult: Secondary | ICD-10-CM | POA: Diagnosis not present

## 2019-05-25 DIAGNOSIS — M1611 Unilateral primary osteoarthritis, right hip: Secondary | ICD-10-CM | POA: Diagnosis not present

## 2019-05-25 DIAGNOSIS — E785 Hyperlipidemia, unspecified: Secondary | ICD-10-CM | POA: Diagnosis not present

## 2019-05-25 DIAGNOSIS — M1711 Unilateral primary osteoarthritis, right knee: Secondary | ICD-10-CM | POA: Diagnosis not present

## 2019-05-25 LAB — CBC
HCT: 32.9 % — ABNORMAL LOW (ref 39.0–52.0)
Hemoglobin: 11.1 g/dL — ABNORMAL LOW (ref 13.0–17.0)
MCH: 32.1 pg (ref 26.0–34.0)
MCHC: 33.7 g/dL (ref 30.0–36.0)
MCV: 95.1 fL (ref 80.0–100.0)
Platelets: 160 10*3/uL (ref 150–400)
RBC: 3.46 MIL/uL — ABNORMAL LOW (ref 4.22–5.81)
RDW: 14 % (ref 11.5–15.5)
WBC: 8.5 10*3/uL (ref 4.0–10.5)
nRBC: 0 % (ref 0.0–0.2)

## 2019-05-25 LAB — BASIC METABOLIC PANEL
Anion gap: 7 (ref 5–15)
BUN: 12 mg/dL (ref 8–23)
CO2: 24 mmol/L (ref 22–32)
Calcium: 8.6 mg/dL — ABNORMAL LOW (ref 8.9–10.3)
Chloride: 106 mmol/L (ref 98–111)
Creatinine, Ser: 0.79 mg/dL (ref 0.61–1.24)
GFR calc Af Amer: >60 mL/min (ref >60)
GFR calc non Af Amer: >60 mL/min (ref >60)
Glucose, Bld: 211 mg/dL — ABNORMAL HIGH (ref 70–99)
Potassium: 4.5 mmol/L (ref 3.5–5.1)
Sodium: 137 mmol/L (ref 135–145)

## 2019-05-25 MED ORDER — OXYCODONE HCL 5 MG PO TABS: 5.0000 mg | ORAL_TABLET | Freq: Four times a day (QID) | ORAL | 0 refills | Status: DC | PRN

## 2019-05-25 MED ORDER — TRAMADOL HCL 50 MG PO TABS: 50.0000 mg | ORAL_TABLET | Freq: Four times a day (QID) | ORAL | 0 refills | Status: DC | PRN

## 2019-05-25 MED ORDER — METHOCARBAMOL 500 MG PO TABS: 500.0000 mg | ORAL_TABLET | Freq: Four times a day (QID) | ORAL | 0 refills | Status: DC | PRN

## 2019-05-25 MED ORDER — ASPIRIN 325 MG PO TBEC: 325.0000 mg | DELAYED_RELEASE_TABLET | Freq: Two times a day (BID) | ORAL | 0 refills | Status: AC

## 2019-05-25 MED ORDER — GABAPENTIN 300 MG PO CAPS: 300.0000 mg | ORAL_CAPSULE | Freq: Three times a day (TID) | ORAL | 0 refills | Status: DC

## 2019-05-25 NOTE — Progress Notes (Signed)
   Subjective: 1 Day Post-Op Procedure(s) (LRB): TOTAL KNEE ARTHROPLASTY (Right) Patient reports pain as mild.   Patient seen in rounds by Dr. Wynelle Link. Patient is well, and has had no acute complaints or problems other than discomfort in the right knee. No acute events overnight. Patient ambulated 80 feet with PT yesterday. Foley catheter removed, positive flatus. Denies CP, SHOB. We will continue therapy today.   Objective: Vital signs in last 24 hours: Temp:  [97.7 F (36.5 C)-98.7 F (37.1 C)] 97.7 F (36.5 C) (10/13 0515) Pulse Rate:  [61-78] 61 (10/13 0515) Resp:  [12-20] 18 (10/13 0515) BP: (132-164)/(65-84) 136/66 (10/13 0515) SpO2:  [97 %-100 %] 98 % (10/13 0515) Weight:  [114.3 kg] 114.3 kg (10/12 0845)  Intake/Output from previous day:  Intake/Output Summary (Last 24 hours) at 05/25/2019 0743 Last data filed at 05/25/2019 0719 Gross per 24 hour  Intake 3815.63 ml  Output 2545 ml  Net 1270.63 ml     Intake/Output this shift: Total I/O In: -  Out: 350 [Urine:350]  Labs: Recent Labs    05/25/19 0331  HGB 11.1*   Recent Labs    05/25/19 0331  WBC 8.5  RBC 3.46*  HCT 32.9*  PLT 160   Recent Labs    05/25/19 0331  NA 137  K 4.5  CL 106  CO2 24  BUN 12  CREATININE 0.79  GLUCOSE 211*  CALCIUM 8.6*   No results for input(s): LABPT, INR in the last 72 hours.  Exam: General - Patient is Alert and Oriented Extremity - Neurologically intact Sensation intact distally Intact pulses distally Dorsiflexion/Plantar flexion intact Dressing - dressing C/D/I Motor Function - intact, moving foot and toes well on exam.   Past Medical History:  Diagnosis Date  . Arthritis    Arthritis knees- rt. hip  . Diverticulosis   . Hyperlipidemia   . Hypertension    tends to elevate in MD office.    Assessment/Plan: 1 Day Post-Op Procedure(s) (LRB): TOTAL KNEE ARTHROPLASTY (Right) Active Problems:   OA (osteoarthritis) of knee  Estimated body mass index  is 35.15 kg/m as calculated from the following:   Height as of this encounter: 5\' 11"  (1.803 m).   Weight as of this encounter: 114.3 kg. Advance diet Up with therapy D/C IV fluids   Patient's anticipated LOS is less than 2 midnights, meeting these requirements: - Lives within 1 hour of care - Has a competent adult at home to recover with post-op recover - NO history of  - Chronic pain requiring opiods  - Diabetes  - Coronary Artery Disease  - Heart failure  - Heart attack  - Stroke  - DVT/VTE  - Cardiac arrhythmia  - Respiratory Failure/COPD  - Renal failure  - Anemia  - Advanced Liver disease  DVT Prophylaxis - Aspirin Weight bearing as tolerated. D/C O2 and pulse ox and try on room air. Hemovac pulled without difficulty, will begin therapy today.  Plan is to go Home after hospital stay. Plan for discharge today following 1-2 sessions of therapy as long as he continues to meet goals with therapy. Scheduled for OPPT at [facility] on 10/16. Follow up in the office in 2 weeks.   Griffith Citron, PA-C Orthopedic Surgery [phone] 05/25/2019, 7:43 AM

## 2019-05-25 NOTE — Progress Notes (Signed)
Physical Therapy Treatment Patient Details Name: Christopher Gibbs. MRN: [id number] DOB: 1946-01-19 Today's Date: 05/25/2019    History of Present Illness s/p R TKA; PMH: L TKA, R THA, HTN    PT Comments    Pt progressing well; reviewed HEP, stairs, gait and safety at home as well as not overdoing it in early stages of healing.    Follow Up Recommendations  Follow surgeon's recommendation for DC plan and follow-up therapies     Equipment Recommendations  None recommended by PT    Recommendations for Other Services       Precautions / Restrictions Precautions Precautions: Knee;Fall Knee Immobilizer - Right: Discontinue once straight leg raise with < 10 degree lag Restrictions Weight Bearing Restrictions: No    Mobility  Bed Mobility Overal bed mobility: Needs Assistance Bed Mobility: Supine to Sit     Supine to sit: Supervision;Modified independent (Device/Increase time)        Transfers Overall transfer level: Needs assistance Equipment used: Rolling walker (2 wheeled) Transfers: Sit to/from Stand Sit to Stand: Supervision         General transfer comment: for safety, cues to wait for RW  Ambulation/Gait Ambulation/Gait assistance: Supervision Gait Distance (Feet): 120 Feet Assistive device: Rolling walker (2 wheeled) Gait Pattern/deviations: Step-through pattern     General Gait Details: cues for sequence and RW position from self, to stay inside RW during turns   Stairs Stairs: Yes Stairs assistance: Min guard Stair Management: No rails;Step to pattern;Forwards;With walker Number of Stairs: 3(x2) General stair comments: cues for sequence and technique, wife present for stair training    Wheelchair Mobility    Modified Rankin (Stroke Patients Only)       Balance                                            Cognition Arousal/Alertness: Awake/alert Behavior During Therapy: WFL for tasks assessed/performed Overall  Cognitive Status: Within Functional Limits for tasks assessed                                        Exercises Total Joint Exercises Ankle Circles/Pumps: AROM;15 reps;Both Quad Sets: AROM;Both;10 reps Short Arc QuadSinclair Ship;Right;10 reps Heel Slides: AAROM;Right;10 reps Hip ABduction/ADduction: AAROM;10 reps;AROM;Right Straight Leg Raises: AROM;AAROM;Right;10 reps Goniometric ROM: 5 to 75 degrees right knee flexion AAROM    General Comments        Pertinent Vitals/Pain Pain Assessment: 0-10 Pain Score: 3  Pain Location: right knee Pain Descriptors / Indicators: Aching;Grimacing;Sore Pain Intervention(s): Limited activity within patient's tolerance;Monitored during session;Premedicated before session;Repositioned;Ice applied    Home Living                      Prior Function            PT Goals (current goals can now be found in the care plan section) Acute Rehab PT Goals PT Goal Formulation: With patient Time For Goal Achievement: 05/31/19 Potential to Achieve Goals: Good Progress towards PT goals: Progressing toward goals    Frequency    7X/week      PT Plan Current plan remains appropriate    Co-evaluation              AM-PAC PT "6 Clicks" Mobility  Outcome Measure  Help needed turning from your back to your side while in a flat bed without using bedrails?: None Help needed moving from lying on your back to sitting on the side of a flat bed without using bedrails?: None Help needed moving to and from a bed to a chair (including a wheelchair)?: A Little Help needed standing up from a chair using your arms (e.g., wheelchair or bedside chair)?: A Little Help needed to walk in hospital room?: A Little Help needed climbing 3-5 steps with a railing? : A Little 6 Click Score: 20    End of Session Equipment Utilized During Treatment: Gait belt;Right knee immobilizer Activity Tolerance: Patient tolerated treatment well Patient  left: in chair;with call bell/phone within reach;with chair alarm set;with family/visitor present   PT Visit Diagnosis: Difficulty in walking, not elsewhere classified (R26.2)     Time: [id number] PT Time Calculation (min) (ACUTE ONLY): 30 min  Charges:  $Gait Training: 8-22 mins $Therapeutic Exercise: 8-22 mins                     Kenyon Ana, PT  Pager: [phone] Acute Rehab Dept [facility]): [phone]   05/25/2019    [facility] 05/25/2019, 12:01 PM

## 2019-05-25 NOTE — Plan of Care (Signed)
Pt ready for DC home 

## 2019-05-25 NOTE — Care Management Obs Status (Signed)
[place] NOTIFICATION   Patient Details  Name: Christopher Gibbs. MRN: [id number] Date of Birth: 05-13-1946   Medicare Observation Status Notification Given:  Yes    Lia Hopping, LCSW 05/25/2019, 11:01 AM

## 2019-05-25 NOTE — Care Management CC44 (Signed)
Condition Code 44 Documentation Completed  Patient Details  Name: Christopher Gibbs. MRN: [id number] Date of Birth: February 26, 1946   Condition Code 44 given:  Yes Patient signature on Condition Code 44 notice:  Yes Documentation of 2 MD's agreement:  Yes Code 44 added to claim:  Yes    Lia Hopping, LCSW 05/25/2019, 11:01 AM

## 2019-05-26 NOTE — Discharge Summary (Signed)
Physician Discharge Summary   Patient ID: Christopher Gibbs. MRN: [id number] DOB/AGE: May 08, 1946 73 y.o.  Admit date: 05/24/2019 Discharge date: 05/25/2019  Primary Diagnosis: Osteoarthritis, right knee   Admission Diagnoses:  Past Medical History:  Diagnosis Date  . Arthritis    Arthritis knees- rt. hip  . Diverticulosis   . Hyperlipidemia   . Hypertension    tends to elevate in MD office.   Discharge Diagnoses:   Active Problems:   OA (osteoarthritis) of knee  Estimated body mass index is 35.15 kg/m as calculated from the following:   Height as of this encounter: 5\' 11"  (1.803 m).   Weight as of this encounter: 114.3 kg.  Procedure:  Procedure(s) (LRB): TOTAL KNEE ARTHROPLASTY (Right)   Consults: None  HPI: Christopher Numa. is a 73 y.o. year old male with end stage OA of his right knee with progressively worsening pain and dysfunction. He has constant pain, with activity and at rest and significant functional deficits with difficulties even with ADLs. He has had extensive non-op management including analgesics, injections of cortisone and viscosupplements, and home exercise program, but remains in significant pain with significant dysfunction. Radiographs show bone on bone arthritis medial and patellofemoral. He presents now for right Total Knee Arthroplasty.    Laboratory Data: Admission on 05/24/2019, Discharged on 05/25/2019  Component Date Value Ref Range Status  . WBC 05/25/2019 8.5  4.0 - 10.5 K/uL Final  . RBC 05/25/2019 3.46* 4.22 - 5.81 MIL/uL Final  . Hemoglobin 05/25/2019 11.1* 13.0 - 17.0 g/dL Final  . HCT 05/25/2019 32.9* 39.0 - 52.0 % Final  . MCV 05/25/2019 95.1  80.0 - 100.0 fL Final  . MCH 05/25/2019 32.1  26.0 - 34.0 pg Final  . MCHC 05/25/2019 33.7  30.0 - 36.0 g/dL Final  . RDW 05/25/2019 14.0  11.5 - 15.5 % Final  . Platelets 05/25/2019 160  150 - 400 K/uL Final  . nRBC 05/25/2019 0.0  0.0 - 0.2 % Final   Performed at [facility], [place] [address], [place], [place] [address]  . Sodium 05/25/2019 137  135 - 145 mmol/L Final  . Potassium 05/25/2019 4.5  3.5 - 5.1 mmol/L Final  . Chloride 05/25/2019 106  98 - 111 mmol/L Final  . CO2 05/25/2019 24  22 - 32 mmol/L Final  . Glucose, Bld 05/25/2019 211* 70 - 99 mg/dL Final  . BUN 05/25/2019 12  8 - 23 mg/dL Final  . Creatinine, Ser 05/25/2019 0.79  0.61 - 1.24 mg/dL Final  . Calcium 05/25/2019 8.6* 8.9 - 10.3 mg/dL Final  . GFR calc non Af Amer 05/25/2019 >60  >60 mL/min Final  . GFR calc Af Amer 05/25/2019 >60  >60 mL/min Final  . Anion gap 05/25/2019 7  5 - 15 Final   Performed at [facility], [place] [address], [place], [place] [address]  Hospital Outpatient Visit on 05/20/2019  Component Date Value Ref Range Status  . SARS-CoV-2, NAA 05/20/2019 NOT DETECTED  NOT DETECTED Final   Comment: (NOTE) This nucleic acid amplification test was developed and its performance characteristics determined by [employer]. Nucleic acid amplification tests include PCR and TMA. This test has not been FDA cleared or approved. This test has been authorized by FDA under an Emergency Use Authorization (EUA). This test is only authorized for the duration of time the declaration that circumstances exist justifying the authorization of the emergency use of in vitro diagnostic tests for detection  of SARS-CoV-2 virus and/or diagnosis of COVID-19 infection under section 564(b)(1) of the Act, 21 U.S.C. [id number]) (1), unless the authorization is terminated or revoked sooner. When diagnostic testing is negative, the possibility of a false negative result should be considered in the context of a patient's recent exposures and the presence of clinical signs and symptoms consistent with COVID-19. An individual without symptoms of COVID- 19 and who is not shedding SARS-CoV-2 vi                          rus would expect to have a negative (not detected) result  in this assay. Performed At: [facility] [place], [place] [id number] Rush Farmer MD [id number]   . Coronavirus Source 05/20/2019 NASOPHARYNGEAL   Final   Performed at [facility], [place] [address] [place], [place] [address]  Hospital Outpatient Visit on 05/20/2019  Component Date Value Ref Range Status  . aPTT 05/20/2019 34  24 - 36 seconds Final   Performed at [facility], [place] [address], [place], [place] [address]  . WBC 05/20/2019 7.0  4.0 - 10.5 K/uL Final  . RBC 05/20/2019 4.32  4.22 - 5.81 MIL/uL Final  . Hemoglobin 05/20/2019 13.9  13.0 - 17.0 g/dL Final  . HCT 05/20/2019 41.9  39.0 - 52.0 % Final  . MCV 05/20/2019 97.0  80.0 - 100.0 fL Final  . MCH 05/20/2019 32.2  26.0 - 34.0 pg Final  . MCHC 05/20/2019 33.2  30.0 - 36.0 g/dL Final  . RDW 05/20/2019 14.3  11.5 - 15.5 % Final  . Platelets 05/20/2019 152  150 - 400 K/uL Final  . nRBC 05/20/2019 0.0  0.0 - 0.2 % Final   Performed at [facility], [place] [address], [place], [place] [address]  . Sodium 05/20/2019 139  135 - 145 mmol/L Final  . Potassium 05/20/2019 4.8  3.5 - 5.1 mmol/L Final  . Chloride 05/20/2019 106  98 - 111 mmol/L Final  . CO2 05/20/2019 24  22 - 32 mmol/L Final  . Glucose, Bld 05/20/2019 102* 70 - 99 mg/dL Final  . BUN 05/20/2019 12  8 - 23 mg/dL Final  . Creatinine, Ser 05/20/2019 0.80  0.61 - 1.24 mg/dL Final  . Calcium 05/20/2019 9.2  8.9 - 10.3 mg/dL Final  . Total Protein 05/20/2019 7.1  6.5 - 8.1 g/dL Final  . Albumin 05/20/2019 4.5  3.5 - 5.0 g/dL Final  . AST 05/20/2019 27  15 - 41 U/L Final  . ALT 05/20/2019 31  0 - 44 U/L Final  . Alkaline Phosphatase 05/20/2019 80  38 - 126 U/L Final  . Total Bilirubin 05/20/2019 0.8  0.3 - 1.2 mg/dL Final  . GFR calc non Af Amer 05/20/2019 >60  >60 mL/min Final  . GFR calc Af Amer 05/20/2019 >60  >60 mL/min Final  . Anion gap 05/20/2019 9  5 - 15 Final   Performed at [facility], [place] [address], [place], [place] [address]  . Prothrombin Time 05/20/2019 12.9  11.4 - 15.2 seconds Final  . INR 05/20/2019 1.0  0.8 - 1.2 Final   Comment: (NOTE) INR goal varies based on device and disease states. Performed at [facility], [place] [address], [place],  [address]   . ABO/RH(D) 05/20/2019 O NEG   Final  . Antibody Screen 05/20/2019 NEG   Final  . Sample Expiration 05/20/2019 05/27/2019,2359  Final  . Extend sample reason 05/20/2019    Final                   Value:NO TRANSFUSIONS OR PREGNANCY IN THE PAST 3 MONTHS Performed at [place] [address], [place], [place] [address]   . MRSA, PCR 05/20/2019 NEGATIVE  NEGATIVE Final  . Staphylococcus aureus 05/20/2019 POSITIVE* NEGATIVE Final   Comment: (NOTE) The Xpert SA Assay (FDA approved for NASAL specimens in patients 2 years of age and older), is one component of a comprehensive surveillance program. It is not intended to diagnose infection nor to guide or monitor treatment. Performed at [facility], [place] [address], [place], [place] [address]      X-Rays:No results found.  EKG:No orders found for this or any previous visit.   Hospital Course: Christopher Marsala. is a 73 y.o. who was [facility] to [facility]. They were brought to the operating room on 05/24/2019 and underwent Procedure(s): TOTAL KNEE ARTHROPLASTY.  Patient tolerated the procedure well and was later transferred to the recovery room and then to the orthopaedic floor for postoperative care. They were given PO and IV analgesics for pain control following their surgery. They were given 24 hours of postoperative antibiotics of  Anti-infectives (From admission, onward)   Start     Dose/Rate Route Frequency Ordered Stop   05/24/19 1700  ceFAZolin (ANCEF) IVPB 2g/100 mL premix     2 g 200 mL/hr over 30 Minutes Intravenous Every 6 hours 05/24/19 1400 05/24/19  2230   05/24/19 0815  ceFAZolin (ANCEF) IVPB 2g/100 mL premix     2 g 200 mL/hr over 30 Minutes Intravenous On call to O.R. 05/24/19 [id number] 05/24/19 1119     and started on DVT prophylaxis in the form of Aspirin.   PT and OT were ordered for total joint protocol. Discharge planning consulted to help with postop disposition and equipment needs.  Patient had a good night on the evening of surgery. They started to get up OOB with therapy on POD #0. Pt was seen during rounds and was ready to go home pending progress with therapy. Hemovac drain was pulled without difficulty. He worked with therapy on POD #1 and was meeting his goals. Pt was discharged to home later that day in stable condition.  Diet: Regular diet Activity: WBAT Follow-up: in 2 weeks Disposition: Home Discharged Condition: good   Discharge Instructions    Call MD / Call 911   Complete by: As directed    If you experience chest pain or shortness of breath, CALL 911 and be transported to the hospital emergency room.  If you develope a fever above 101 F, pus (Rodick drainage) or increased drainage or redness at the wound, or calf pain, call your surgeon's office.   Change dressing   Complete by: As directed    Change dressing on Wednesday, then change the dressing daily with sterile 4 x 4 inch gauze dressing and apply TED hose.   Constipation Prevention   Complete by: As directed    Drink plenty of fluids.  Prune juice may be helpful.  You may use a stool softener, such as Colace (over the counter) 100 mg twice a day.  Use MiraLax (over the counter) for constipation as needed.   Diet - low sodium heart healthy   Complete by: As directed    Discharge instructions   Complete by: As directed    Dr. Gaynelle Arabian  Total Joint Specialist [facility] [address] [place], [place] [address] [phone]  TOTAL KNEE REPLACEMENT POSTOPERATIVE DIRECTIONS  Knee Rehabilitation, Guidelines Following Surgery  Results after  knee surgery are often greatly improved when you follow the exercise, range of motion and muscle strengthening exercises prescribed by your doctor. Safety measures are also important to protect the knee from further injury. Any time any of these exercises cause you to have increased pain or swelling in your knee joint, decrease the amount until you are comfortable again and slowly increase them. If you have problems or questions, call your caregiver or physical therapist for advice.   HOME CARE INSTRUCTIONS  Remove items at home which could result in a fall. This includes throw rugs or furniture in walking pathways.  ICE to the affected knee every three hours for 30 minutes at a time and then as needed for pain and swelling.  Continue to use ice on the knee for pain and swelling from surgery. You may notice swelling that will progress down to the foot and ankle.  This is normal after surgery.  Elevate the leg when you are not up walking on it.   Continue to use the breathing machine which will help keep your temperature down.  It is common for your temperature to cycle up and down following surgery, especially at night when you are not up moving around and exerting yourself.  The breathing machine keeps your lungs expanded and your temperature down. Do not place pillow under knee, focus on keeping the knee straight while resting   DIET You may resume your previous home diet once your are discharged from the hospital.  DRESSING / WOUND CARE / SHOWERING You may change your dressing 3-5 days after surgery.  Then change the dressing every day with sterile gauze.  Please use good hand washing techniques before changing the dressing.  Do not use any lotions or creams on the incision until instructed by your surgeon. You may start showering once you are discharged home but do not submerge the incision under water. Just pat the incision dry and apply a dry gauze dressing on daily. Change the surgical dressing  daily and reapply a dry dressing each time.  ACTIVITY Walk with your walker as instructed. Use walker as long as suggested by your caregivers. Avoid periods of inactivity such as sitting longer than an hour when not asleep. This helps prevent blood clots.  You may resume a sexual relationship in one month or when given the OK by your doctor.  You may return to work once you are cleared by your doctor.  Do not drive a car for 6 weeks or until released by you surgeon.  Do not drive while taking narcotics.  WEIGHT BEARING Weight bearing as tolerated with assist device (walker, cane, etc) as directed, use it as long as suggested by your surgeon or therapist, typically at least 4-6 weeks.  POSTOPERATIVE CONSTIPATION PROTOCOL Constipation - defined medically as fewer than three stools per week and severe constipation as less than one stool per week.  One of the most common issues patients have following surgery is constipation.  Even if you have a regular bowel pattern at home, your normal regimen is likely to be disrupted due to multiple reasons following surgery.  Combination of anesthesia, postoperative narcotics, change in appetite and fluid intake all can affect your bowels.  In order to avoid complications following surgery, here are some recommendations in order to help you  during your recovery period.  Colace (docusate) - Pick up an over-the-counter form of Colace or another stool softener and take twice a day as long as you are requiring postoperative pain medications.  Take with a full glass of water daily.  If you experience loose stools or diarrhea, hold the colace until you stool forms back up.  If your symptoms do not get better within 1 week or if they get worse, check with your doctor.  Dulcolax (bisacodyl) - Pick up over-the-counter and take as directed by the product packaging as needed to assist with the movement of your bowels.  Take with a full glass of water.  Use this product as  needed if not relieved by Colace only.   MiraLax (polyethylene glycol) - Pick up over-the-counter to have on hand.  MiraLax is a solution that will increase the amount of water in your bowels to assist with bowel movements.  Take as directed and can mix with a glass of water, juice, soda, coffee, or tea.  Take if you go more than two days without a movement. Do not use MiraLax more than once per day. Call your doctor if you are still constipated or irregular after using this medication for 7 days in a row.  If you continue to have problems with postoperative constipation, please contact the office for further assistance and recommendations.  If you experience "the worst abdominal pain ever" or develop nausea or vomiting, please contact the office immediatly for further recommendations for treatment.  ITCHING  If you experience itching with your medications, try taking only a single pain pill, or even half a pain pill at a time.  You can also use Benadryl over the counter for itching or also to help with sleep.   TED HOSE STOCKINGS Wear the elastic stockings on both legs for three weeks following surgery during the day but you may remove then at night for sleeping.  MEDICATIONS See your medication summary on the "After Visit Summary" that the nursing staff will review with you prior to discharge.  You may have some home medications which will be placed on hold until you complete the course of blood thinner medication.  It is important for you to complete the blood thinner medication as prescribed by your surgeon.  Continue your approved medications as instructed at time of discharge.  PRECAUTIONS If you experience chest pain or shortness of breath - call 911 immediately for transfer to the hospital emergency department.  If you develop a fever greater that 101 F, purulent drainage from wound, increased redness or drainage from wound, foul odor from the wound/dressing, or calf pain - CONTACT YOUR  SURGEON.                                                   FOLLOW-UP APPOINTMENTS Make sure you keep all of your appointments after your operation with your surgeon and caregivers. You should call the office at the above phone number and make an appointment for approximately two weeks after the date of your surgery or on the date instructed by your surgeon outlined in the "After Visit Summary".   RANGE OF MOTION AND STRENGTHENING EXERCISES  Rehabilitation of the knee is important following a knee injury or an operation. After just a few days of immobilization, the muscles of the thigh which control  the knee become weakened and shrink (atrophy). Knee exercises are designed to build up the tone and strength of the thigh muscles and to improve knee motion. Often times heat used for twenty to thirty minutes before working out will loosen up your tissues and help with improving the range of motion but do not use heat for the first two weeks following surgery. These exercises can be done on a training (exercise) mat, on the floor, on a table or on a bed. Use what ever works the best and is most comfortable for you Knee exercises include:  Leg Lifts - While your knee is still immobilized in a splint or cast, you can do straight leg raises. Lift the leg to 60 degrees, hold for 3 sec, and slowly lower the leg. Repeat 10-20 times 2-3 times daily. Perform this exercise against resistance later as your knee gets better.  Quad and Hamstring Sets - Tighten up the muscle on the front of the thigh (Quad) and hold for 5-10 sec. Repeat this 10-20 times hourly. Hamstring sets are done by pushing the foot backward against an object and holding for 5-10 sec. Repeat as with quad sets.  Leg Slides: Lying on your back, slowly slide your foot toward your buttocks, bending your knee up off the floor (only go as far as is comfortable). Then slowly slide your foot back down until your leg is flat on the floor again. Angel Wings:  Lying on your back spread your legs to the side as far apart as you can without causing discomfort.  A rehabilitation program following serious knee injuries can speed recovery and prevent re-injury in the future due to weakened muscles. Contact your doctor or a physical therapist for more information on knee rehabilitation.   IF YOU ARE TRANSFERRED TO A SKILLED REHAB FACILITY If the patient is transferred to a skilled rehab facility following release from the hospital, a list of the current medications will be sent to the facility for the patient to continue.  When discharged from the skilled rehab facility, please have the facility set up the patient's [place] prior to being released. Also, the skilled facility will be responsible for providing the patient with their medications at time of release from the facility to include their pain medication, the muscle relaxants, and their blood thinner medication. If the patient is still at the rehab facility at time of the two week follow up appointment, the skilled rehab facility will also need to assist the patient in arranging follow up appointment in our office and any transportation needs.  MAKE SURE YOU:  Understand these instructions.  Get help right away if you are not doing well or get worse.    Pick up stool softner and laxative for home use following surgery while on pain medications. Do not submerge incision under water. Please use good hand washing techniques while changing dressing each day. May shower starting three days after surgery. Please use a clean towel to pat the incision dry following showers. Continue to use ice for pain and swelling after surgery. Do not use any lotions or creams on the incision until instructed by your surgeon.   Do not put a pillow under the knee. Place it under the heel.   Complete by: As directed    Driving restrictions   Complete by: As directed    No driving for two weeks   TED  hose   Complete by: As directed    Use stockings (  TED hose) for three weeks on both leg(s).  You may remove them at night for sleeping.   Weight bearing as tolerated   Complete by: As directed      Allergies as of 05/25/2019   No Known Allergies     Medication List    TAKE these medications   amLODipine 2.5 MG tablet Commonly known as: NORVASC Take 2.5 mg by mouth daily.   aspirin 325 MG EC tablet Take 1 tablet (325 mg total) by mouth 2 (two) times daily for 20 days. Take one tablet (325 mg) Aspirin two times a day for three weeks following surgery.Then take one baby Aspirin (81 mg) once a day for three weeks.Then discontinue aspirin.   atorvastatin 10 MG tablet Commonly known as: LIPITOR Take 10 mg by mouth daily.   gabapentin 300 MG capsule Commonly known as: NEURONTIN Take 1 capsule (300 mg total) by mouth 3 (three) times daily. Take a 300 mg capsule three times a day for two weeks following surgery.Then take a 300 mg capsule two times a day for two weeks. Then take a 300 mg capsule once a day for two weeks. Then discontinue the Gabapentin.   methocarbamol 500 MG tablet Commonly known as: ROBAXIN Take 1 tablet (500 mg total) by mouth every 6 (six) hours as needed for muscle spasms.   oxyCODONE 5 MG immediate release tablet Commonly known as: Oxy IR/ROXICODONE Take 1-2 tablets (5-10 mg total) by mouth every 6 (six) hours as needed for moderate pain (pain score 4-6).   traMADol 50 MG tablet Commonly known as: ULTRAM Take 1-2 tablets (50-100 mg total) by mouth every 6 (six) hours as needed for moderate pain.            Discharge Care Instructions  (From admission, onward)         Start     Ordered   05/25/19 0000  Weight bearing as tolerated     05/25/19 0748   05/25/19 0000  Change dressing    Comments: Change dressing on Wednesday, then change the dressing daily with sterile 4 x 4 inch gauze dressing and apply TED hose.   05/25/19 0748         Follow-up  Information    Gaynelle Arabian, MD. Go on 06/08/2019.   Specialty: Orthopedic Surgery Why: You are scheduled for a post-operative appointmet on 06-08-19 at 1:30 pm.  Contact information: [address] [place] [address] [id number]        Rosilyn Mings.. Go on 05/27/2019.   Why: You are scheduled for a physical therapy appointment on 05-27-19 at 10:00 am.  Contact information: [place] [address] [id number]           Signed: Griffith Citron, PA-C Orthopedic Surgery 05/26/2019, 1:01 PM

## 2019-05-27 DIAGNOSIS — M25561 Pain in right knee: Secondary | ICD-10-CM | POA: Diagnosis not present

## 2019-05-27 DIAGNOSIS — M25661 Stiffness of right knee, not elsewhere classified: Secondary | ICD-10-CM | POA: Diagnosis not present

## 2019-05-31 DIAGNOSIS — M25561 Pain in right knee: Secondary | ICD-10-CM | POA: Diagnosis not present

## 2019-05-31 DIAGNOSIS — M25661 Stiffness of right knee, not elsewhere classified: Secondary | ICD-10-CM | POA: Diagnosis not present

## 2019-06-02 DIAGNOSIS — M25561 Pain in right knee: Secondary | ICD-10-CM | POA: Diagnosis not present

## 2019-06-02 DIAGNOSIS — M25661 Stiffness of right knee, not elsewhere classified: Secondary | ICD-10-CM | POA: Diagnosis not present

## 2019-06-04 DIAGNOSIS — M25561 Pain in right knee: Secondary | ICD-10-CM | POA: Diagnosis not present

## 2019-06-04 DIAGNOSIS — M25661 Stiffness of right knee, not elsewhere classified: Secondary | ICD-10-CM | POA: Diagnosis not present

## 2019-06-07 DIAGNOSIS — M25561 Pain in right knee: Secondary | ICD-10-CM | POA: Diagnosis not present

## 2019-06-07 DIAGNOSIS — M25661 Stiffness of right knee, not elsewhere classified: Secondary | ICD-10-CM | POA: Diagnosis not present

## 2019-06-09 DIAGNOSIS — M25561 Pain in right knee: Secondary | ICD-10-CM | POA: Diagnosis not present

## 2019-06-11 DIAGNOSIS — M25561 Pain in right knee: Secondary | ICD-10-CM | POA: Diagnosis not present

## 2019-06-15 DIAGNOSIS — M25561 Pain in right knee: Secondary | ICD-10-CM | POA: Diagnosis not present

## 2019-06-17 DIAGNOSIS — M25561 Pain in right knee: Secondary | ICD-10-CM | POA: Diagnosis not present

## 2019-06-17 DIAGNOSIS — C44719 Basal cell carcinoma of skin of left lower limb, including hip: Secondary | ICD-10-CM | POA: Diagnosis not present

## 2019-06-17 DIAGNOSIS — L570 Actinic keratosis: Secondary | ICD-10-CM | POA: Diagnosis not present

## 2019-06-17 DIAGNOSIS — Z85828 Personal history of other malignant neoplasm of skin: Secondary | ICD-10-CM | POA: Diagnosis not present

## 2019-06-17 DIAGNOSIS — D485 Neoplasm of uncertain behavior of skin: Secondary | ICD-10-CM | POA: Diagnosis not present

## 2019-06-21 DIAGNOSIS — M25561 Pain in right knee: Secondary | ICD-10-CM | POA: Diagnosis not present

## 2019-06-24 DIAGNOSIS — M25561 Pain in right knee: Secondary | ICD-10-CM | POA: Diagnosis not present

## 2019-06-29 DIAGNOSIS — Z471 Aftercare following joint replacement surgery: Secondary | ICD-10-CM | POA: Diagnosis not present

## 2019-06-29 DIAGNOSIS — Z96651 Presence of right artificial knee joint: Secondary | ICD-10-CM | POA: Diagnosis not present

## 2019-06-30 DIAGNOSIS — Z20828 Contact with and (suspected) exposure to other viral communicable diseases: Secondary | ICD-10-CM | POA: Diagnosis not present

## 2019-06-30 DIAGNOSIS — Z6833 Body mass index (BMI) 33.0-33.9, adult: Secondary | ICD-10-CM | POA: Diagnosis not present

## 2019-08-07 IMAGING — CR CHEST - 2 VIEW
2 series · 2 of 2 positions shown · non-contrast
Comparison: 05/28/2016.

CLINICAL DATA: Pre left knee replacement evaluation. Treated
hypertension.

EXAM:
CHEST - 2 VIEW

[w chest pa]
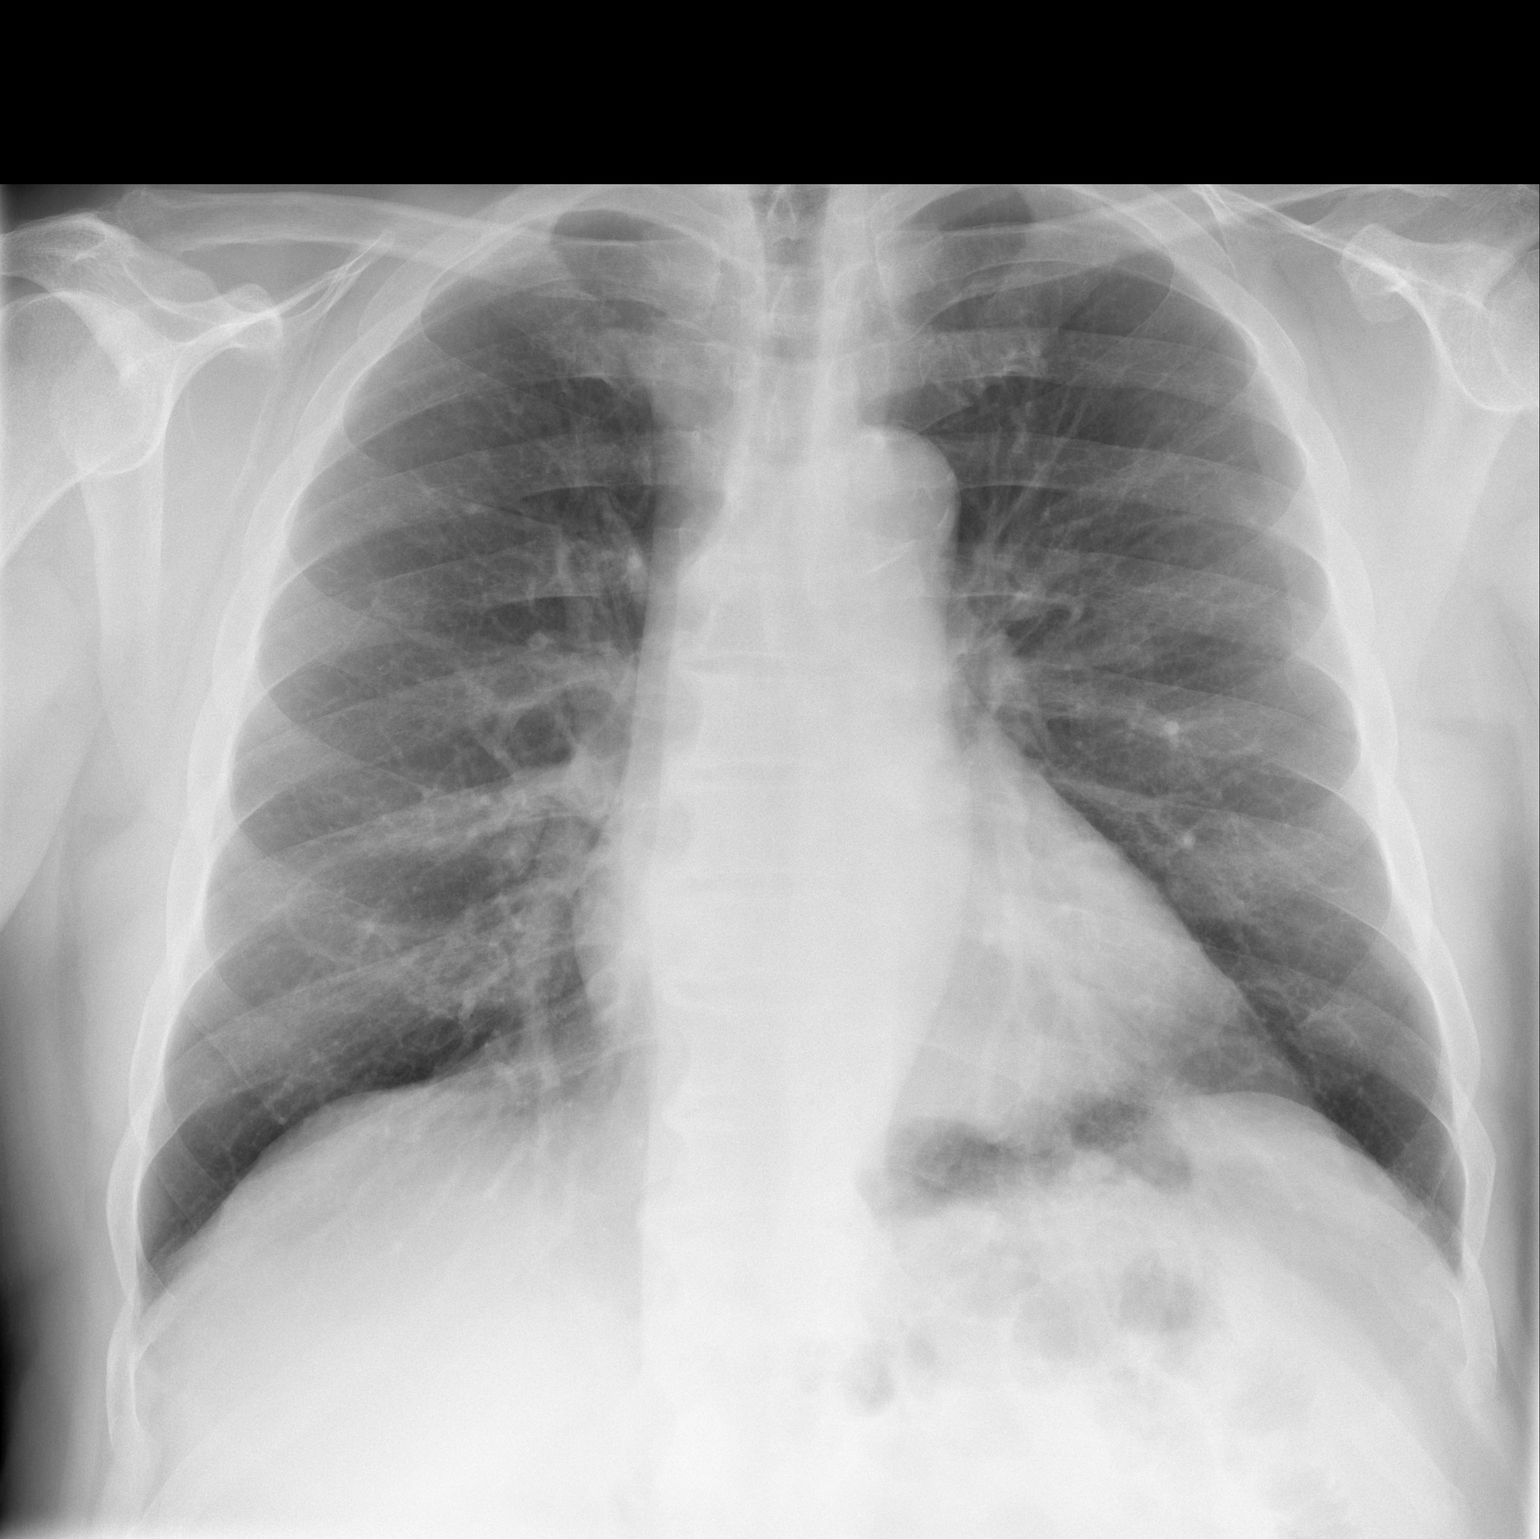

[w chest lat]
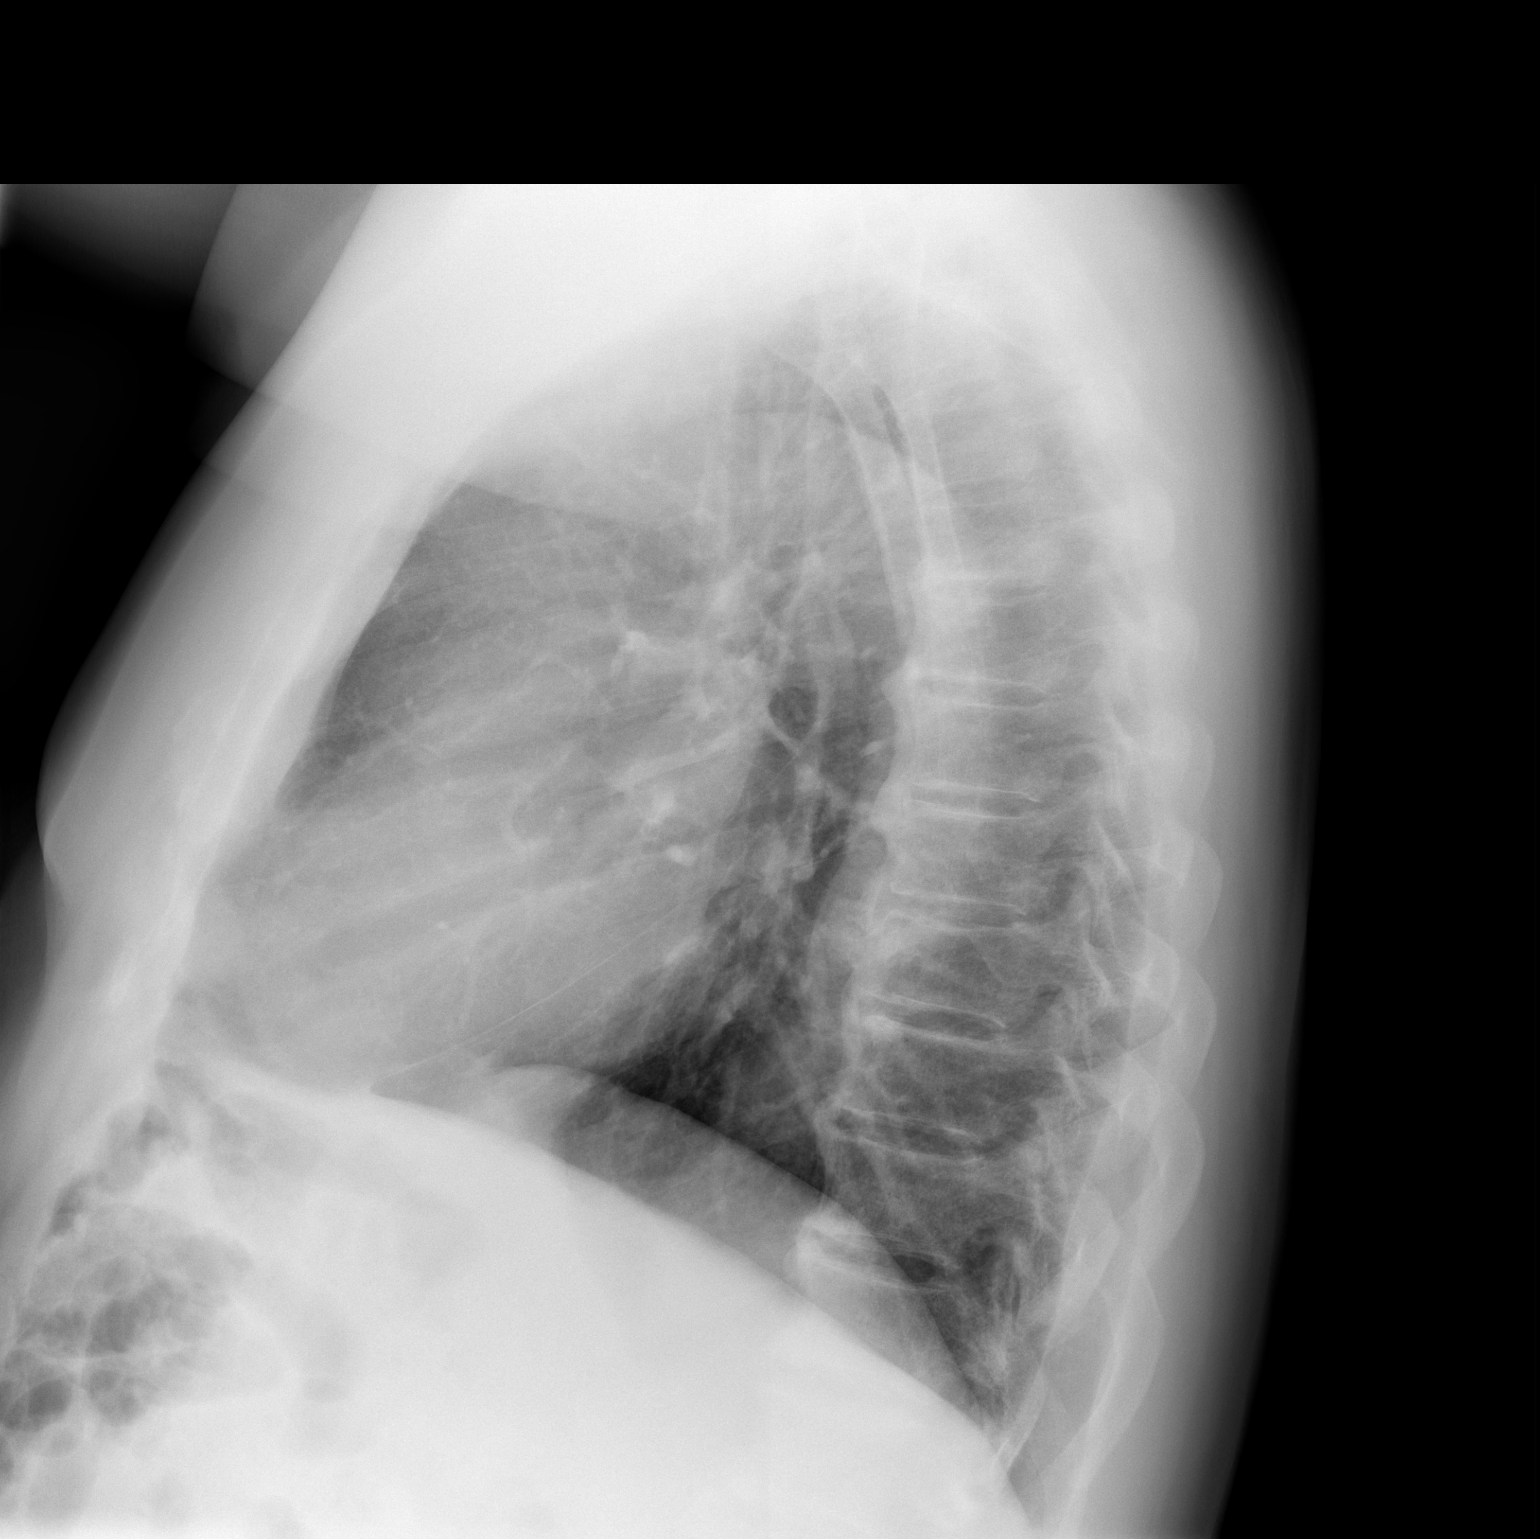

[2 of 2 positions shown; findings below may reference images not displayed]

FINDINGS: Normal sized heart. Mildly tortuous and calcified thoracic aorta.
Clear lungs. Thoracic spine degenerative changes.
IMPRESSION: No acute abnormality.

## 2019-08-11 DIAGNOSIS — U071 COVID-19: Secondary | ICD-10-CM | POA: Diagnosis not present

## 2019-08-11 DIAGNOSIS — R519 Headache, unspecified: Secondary | ICD-10-CM | POA: Diagnosis not present

## 2019-10-19 DIAGNOSIS — Z23 Encounter for immunization: Secondary | ICD-10-CM | POA: Diagnosis not present

## 2019-10-27 DIAGNOSIS — I10 Essential (primary) hypertension: Secondary | ICD-10-CM | POA: Diagnosis not present

## 2019-10-27 DIAGNOSIS — Z79899 Other long term (current) drug therapy: Secondary | ICD-10-CM | POA: Diagnosis not present

## 2019-10-27 DIAGNOSIS — E7800 Pure hypercholesterolemia, unspecified: Secondary | ICD-10-CM | POA: Diagnosis not present

## 2019-10-27 DIAGNOSIS — Z125 Encounter for screening for malignant neoplasm of prostate: Secondary | ICD-10-CM | POA: Diagnosis not present

## 2019-10-27 DIAGNOSIS — E669 Obesity, unspecified: Secondary | ICD-10-CM | POA: Diagnosis not present

## 2019-10-27 DIAGNOSIS — Z0001 Encounter for general adult medical examination with abnormal findings: Secondary | ICD-10-CM | POA: Diagnosis not present

## 2019-11-16 DIAGNOSIS — Z23 Encounter for immunization: Secondary | ICD-10-CM | POA: Diagnosis not present

## 2020-01-11 DIAGNOSIS — L821 Other seborrheic keratosis: Secondary | ICD-10-CM | POA: Diagnosis not present

## 2020-01-11 DIAGNOSIS — L570 Actinic keratosis: Secondary | ICD-10-CM | POA: Diagnosis not present

## 2020-01-11 DIAGNOSIS — Z85828 Personal history of other malignant neoplasm of skin: Secondary | ICD-10-CM | POA: Diagnosis not present

## 2020-01-11 DIAGNOSIS — D1801 Hemangioma of skin and subcutaneous tissue: Secondary | ICD-10-CM | POA: Diagnosis not present

## 2020-01-11 DIAGNOSIS — L812 Freckles: Secondary | ICD-10-CM | POA: Diagnosis not present

## 2020-03-16 DIAGNOSIS — Z471 Aftercare following joint replacement surgery: Secondary | ICD-10-CM | POA: Diagnosis not present

## 2020-03-16 DIAGNOSIS — Z96653 Presence of artificial knee joint, bilateral: Secondary | ICD-10-CM | POA: Diagnosis not present

## 2020-03-16 DIAGNOSIS — Z96641 Presence of right artificial hip joint: Secondary | ICD-10-CM | POA: Diagnosis not present

## 2020-05-08 DIAGNOSIS — Z23 Encounter for immunization: Secondary | ICD-10-CM | POA: Diagnosis not present

## 2020-06-25 DIAGNOSIS — Z23 Encounter for immunization: Secondary | ICD-10-CM | POA: Diagnosis not present

## 2020-10-30 DIAGNOSIS — I10 Essential (primary) hypertension: Secondary | ICD-10-CM | POA: Diagnosis not present

## 2020-10-30 DIAGNOSIS — Z0001 Encounter for general adult medical examination with abnormal findings: Secondary | ICD-10-CM | POA: Diagnosis not present

## 2020-10-30 DIAGNOSIS — Z79899 Other long term (current) drug therapy: Secondary | ICD-10-CM | POA: Diagnosis not present

## 2020-10-30 DIAGNOSIS — E7800 Pure hypercholesterolemia, unspecified: Secondary | ICD-10-CM | POA: Diagnosis not present

## 2020-10-30 DIAGNOSIS — E669 Obesity, unspecified: Secondary | ICD-10-CM | POA: Diagnosis not present

## 2020-10-30 DIAGNOSIS — R7309 Other abnormal glucose: Secondary | ICD-10-CM | POA: Diagnosis not present

## 2021-01-17 DIAGNOSIS — L821 Other seborrheic keratosis: Secondary | ICD-10-CM | POA: Diagnosis not present

## 2021-01-17 DIAGNOSIS — Z85828 Personal history of other malignant neoplasm of skin: Secondary | ICD-10-CM | POA: Diagnosis not present

## 2021-01-17 DIAGNOSIS — D225 Melanocytic nevi of trunk: Secondary | ICD-10-CM | POA: Diagnosis not present

## 2021-01-17 DIAGNOSIS — D1801 Hemangioma of skin and subcutaneous tissue: Secondary | ICD-10-CM | POA: Diagnosis not present

## 2021-05-17 DIAGNOSIS — Z23 Encounter for immunization: Secondary | ICD-10-CM | POA: Diagnosis not present

## 2021-08-24 DIAGNOSIS — Z23 Encounter for immunization: Secondary | ICD-10-CM | POA: Diagnosis not present

## 2021-11-05 DIAGNOSIS — I10 Essential (primary) hypertension: Secondary | ICD-10-CM | POA: Diagnosis not present

## 2021-11-05 DIAGNOSIS — Z79899 Other long term (current) drug therapy: Secondary | ICD-10-CM | POA: Diagnosis not present

## 2021-11-05 DIAGNOSIS — R7309 Other abnormal glucose: Secondary | ICD-10-CM | POA: Diagnosis not present

## 2021-11-05 DIAGNOSIS — E669 Obesity, unspecified: Secondary | ICD-10-CM | POA: Diagnosis not present

## 2021-11-05 DIAGNOSIS — E7800 Pure hypercholesterolemia, unspecified: Secondary | ICD-10-CM | POA: Diagnosis not present

## 2021-11-05 DIAGNOSIS — Z0000 Encounter for general adult medical examination without abnormal findings: Secondary | ICD-10-CM | POA: Diagnosis not present

## 2021-12-06 ENCOUNTER — Other Ambulatory Visit (HOSPITAL_COMMUNITY): Payer: Self-pay

## 2021-12-25 DIAGNOSIS — L821 Other seborrheic keratosis: Secondary | ICD-10-CM | POA: Diagnosis not present

## 2021-12-25 DIAGNOSIS — D489 Neoplasm of uncertain behavior, unspecified: Secondary | ICD-10-CM | POA: Diagnosis not present

## 2021-12-25 DIAGNOSIS — D485 Neoplasm of uncertain behavior of skin: Secondary | ICD-10-CM | POA: Diagnosis not present

## 2021-12-25 DIAGNOSIS — Z85828 Personal history of other malignant neoplasm of skin: Secondary | ICD-10-CM | POA: Diagnosis not present

## 2021-12-25 DIAGNOSIS — L570 Actinic keratosis: Secondary | ICD-10-CM | POA: Diagnosis not present

## 2022-01-11 DIAGNOSIS — H524 Presbyopia: Secondary | ICD-10-CM | POA: Diagnosis not present

## 2022-01-11 DIAGNOSIS — H2513 Age-related nuclear cataract, bilateral: Secondary | ICD-10-CM | POA: Diagnosis not present

## 2022-04-29 DIAGNOSIS — L570 Actinic keratosis: Secondary | ICD-10-CM | POA: Diagnosis not present

## 2022-04-29 DIAGNOSIS — C44719 Basal cell carcinoma of skin of left lower limb, including hip: Secondary | ICD-10-CM | POA: Diagnosis not present

## 2022-04-29 DIAGNOSIS — D485 Neoplasm of uncertain behavior of skin: Secondary | ICD-10-CM | POA: Diagnosis not present

## 2022-04-29 DIAGNOSIS — L821 Other seborrheic keratosis: Secondary | ICD-10-CM | POA: Diagnosis not present

## 2022-04-29 DIAGNOSIS — Z85828 Personal history of other malignant neoplasm of skin: Secondary | ICD-10-CM | POA: Diagnosis not present

## 2022-05-28 DIAGNOSIS — Z23 Encounter for immunization: Secondary | ICD-10-CM | POA: Diagnosis not present

## 2022-05-30 DIAGNOSIS — Z23 Encounter for immunization: Secondary | ICD-10-CM | POA: Diagnosis not present

## 2022-11-05 DIAGNOSIS — L821 Other seborrheic keratosis: Secondary | ICD-10-CM | POA: Diagnosis not present

## 2022-11-05 DIAGNOSIS — Z85828 Personal history of other malignant neoplasm of skin: Secondary | ICD-10-CM | POA: Diagnosis not present

## 2022-11-05 DIAGNOSIS — L570 Actinic keratosis: Secondary | ICD-10-CM | POA: Diagnosis not present

## 2023-02-19 ENCOUNTER — Encounter: Payer: Self-pay | Admitting: Gastroenterology

## 2023-05-12 DIAGNOSIS — L812 Freckles: Secondary | ICD-10-CM | POA: Diagnosis not present

## 2023-05-12 DIAGNOSIS — Z85828 Personal history of other malignant neoplasm of skin: Secondary | ICD-10-CM | POA: Diagnosis not present

## 2023-05-12 DIAGNOSIS — L821 Other seborrheic keratosis: Secondary | ICD-10-CM | POA: Diagnosis not present

## 2023-05-12 DIAGNOSIS — L570 Actinic keratosis: Secondary | ICD-10-CM | POA: Diagnosis not present

## 2023-05-12 DIAGNOSIS — D485 Neoplasm of uncertain behavior of skin: Secondary | ICD-10-CM | POA: Diagnosis not present

## 2023-05-12 DIAGNOSIS — C44519 Basal cell carcinoma of skin of other part of trunk: Secondary | ICD-10-CM | POA: Diagnosis not present

## 2023-06-04 ENCOUNTER — Ambulatory Visit (AMBULATORY_SURGERY_CENTER): Payer: Medicare Other | Admitting: *Deleted

## 2023-06-04 VITALS — Ht 71.0 in | Wt 240.0 lb

## 2023-06-04 DIAGNOSIS — Z1211 Encounter for screening for malignant neoplasm of colon: Secondary | ICD-10-CM

## 2023-06-04 MED ORDER — NA SULFATE-K SULFATE-MG SULF 17.5-3.13-1.6 GM/177ML PO SOLN: 1.0000 | Freq: Once | ORAL | 0 refills | Status: AC

## 2023-06-04 NOTE — Progress Notes (Signed)
Pt's name and DOB verified at the beginning of the pre-visit wit 2 identifiers  Pt denies any difficulty with ambulating,sitting, laying down or rolling side to side  Gave both [facility] main # and MD on call # prior to instructions.   No egg or soy allergy known to patient   No issues known to pt with past sedation with any surgeries or procedures  Pt denies having issues being intubated  Pt has no issues moving head neck or swallowing  No FH of Malignant Hyperthermia  Pt is not on diet pills or shots  Pt is not on home 02   Pt is not on blood thinners   Pt denies issues with constipation   Pt is not on dialysis  Pt denise any abnormal heart rhythms   Pt denies any upcoming cardiac testing  Pt encouraged to use to use Singlecare or Goodrx to reduce cost   Patient's chart reviewed by Cathlyn Parsons CNRA prior to pre-visit and patient appropriate for the [facility].  Pre-visit completed and red dot placed by patient's name on their procedure day (on provider's schedule).  .  Visit by phone  Pt states weight is 240 lb  Instructed pt why it is important to and  to call if they have any changes in health or new medications. Directed them to the # given and on instructions.     Instructions reviewed with pt and pt states understanding. Instructed to review again prior to procedure. Pt states they will.   Instructions sent by mail with coupon and by my chart

## 2023-06-10 DIAGNOSIS — Z1331 Encounter for screening for depression: Secondary | ICD-10-CM | POA: Diagnosis not present

## 2023-06-10 DIAGNOSIS — R7301 Impaired fasting glucose: Secondary | ICD-10-CM | POA: Diagnosis not present

## 2023-06-10 DIAGNOSIS — E7800 Pure hypercholesterolemia, unspecified: Secondary | ICD-10-CM | POA: Diagnosis not present

## 2023-06-10 DIAGNOSIS — Z79899 Other long term (current) drug therapy: Secondary | ICD-10-CM | POA: Diagnosis not present

## 2023-06-10 DIAGNOSIS — Z23 Encounter for immunization: Secondary | ICD-10-CM | POA: Diagnosis not present

## 2023-06-10 DIAGNOSIS — Z0001 Encounter for general adult medical examination with abnormal findings: Secondary | ICD-10-CM | POA: Diagnosis not present

## 2023-06-29 ENCOUNTER — Encounter: Payer: Self-pay | Admitting: Certified Registered Nurse Anesthetist

## 2023-07-03 ENCOUNTER — Encounter: Payer: Self-pay | Admitting: Gastroenterology

## 2023-07-03 ENCOUNTER — Ambulatory Visit: Payer: Medicare Other | Admitting: Gastroenterology

## 2023-07-03 VITALS — BP 116/71 | HR 50 | Temp 97.7°F | Resp 15 | Ht 71.0 in | Wt 240.0 lb

## 2023-07-03 DIAGNOSIS — I10 Essential (primary) hypertension: Secondary | ICD-10-CM | POA: Diagnosis not present

## 2023-07-03 DIAGNOSIS — K648 Other hemorrhoids: Secondary | ICD-10-CM | POA: Diagnosis not present

## 2023-07-03 DIAGNOSIS — E785 Hyperlipidemia, unspecified: Secondary | ICD-10-CM | POA: Diagnosis not present

## 2023-07-03 DIAGNOSIS — Z1211 Encounter for screening for malignant neoplasm of colon: Secondary | ICD-10-CM | POA: Diagnosis not present

## 2023-07-03 DIAGNOSIS — D122 Benign neoplasm of ascending colon: Secondary | ICD-10-CM

## 2023-07-03 DIAGNOSIS — K5730 Diverticulosis of large intestine without perforation or abscess without bleeding: Secondary | ICD-10-CM

## 2023-07-03 MED ORDER — SODIUM CHLORIDE 0.9 % IV SOLN: 500.0000 mL | INTRAVENOUS | Status: DC

## 2023-07-03 NOTE — Progress Notes (Signed)
History and Physical:  This patient presents for endoscopic testing for: Encounter Diagnosis  Name Primary?   Special screening for malignant neoplasms, colon Yes    Average risk for colorectal cancer. No polyps last colonoscopy Nov 2014 Patient denies chronic abdominal pain, rectal bleeding, constipation or diarrhea.   Patient is otherwise without complaints or active issues today.   Past Medical History: Past Medical History:  Diagnosis Date   Arthritis    Arthritis knees- rt. hip   Diverticulosis    Hyperlipidemia    Hypertension    tends to elevate in MD office.     Past Surgical History: Past Surgical History:  Procedure Laterality Date   COLONOSCOPY     JOINT REPLACEMENT Right 2017   KNEE ARTHROSCOPY     bilat   TOTAL HIP ARTHROPLASTY Right 06/10/2016   Procedure: RIGHT TOTAL HIP ARTHROPLASTY ANTERIOR APPROACH;  Surgeon: Ollen Gross, MD;  Location: [facility];  Service: Orthopedics;  Laterality: Right;   TOTAL KNEE ARTHROPLASTY Left 02/08/2019   Procedure: TOTAL KNEE ARTHROPLASTY;  Surgeon: Ollen Gross, MD;  Location: [facility];  Service: Orthopedics;  Laterality: Left;    TOTAL KNEE ARTHROPLASTY Right 05/24/2019   Procedure: TOTAL KNEE ARTHROPLASTY;  Surgeon: Ollen Gross, MD;  Location: [facility];  Service: Orthopedics;  Laterality: Right;     Allergies: No Known Allergies  Outpatient Meds: Current Outpatient Medications  Medication Sig Dispense Refill   amLODipine (NORVASC) 2.5 MG tablet Take 2.5 mg by mouth daily.     atorvastatin (LIPITOR) 10 MG tablet Take 10 mg by mouth daily.     Naproxen Sodium (ALEVE PO)      Current Facility-Administered Medications  Medication Dose Route Frequency Provider Last Rate Last Admin   0.9 %  sodium chloride infusion  500 mL Intravenous Continuous Danis, Starr Lake III, MD          ___________________________________________________________________ Objective   Exam:  BP 139/74   Pulse 65   Temp 97.7 F  (36.5 C)   Resp 10   Ht 5\' 11"  (1.803 m)   Wt 240 lb (108.9 kg)   SpO2 97%   BMI 33.47 kg/m   CV: regular , S1/S2 Resp: clear to auscultation bilaterally, normal RR and effort noted GI: soft, no tenderness, with active bowel sounds.   Assessment: Encounter Diagnosis  Name Primary?   Special screening for malignant neoplasms, colon Yes     Plan: Colonoscopy   The benefits and risks of the planned procedure were described in detail with the patient or (when appropriate) their health care proxy.  Risks were outlined as including, but not limited to, bleeding, infection, perforation, adverse medication reaction leading to cardiac or pulmonary decompensation, pancreatitis (if ERCP).  The limitation of incomplete mucosal visualization was also discussed.  No guarantees or warranties were given.  The patient is appropriate for an endoscopic procedure in the ambulatory setting.   - Amada Jupiter, MD

## 2023-07-03 NOTE — Progress Notes (Signed)
To pacu, VSS. Report to [email or web address].tb 

## 2023-07-03 NOTE — Op Note (Signed)
[facility] Patient Name: Christopher Gibbs Procedure Date: 07/03/2023 10:34 AM MRN: [id number] Endoscopist: Sherilyn Cooter L. Myrtie Neither , MD, [id number] Age: 77 Referring MD:  Date of Birth: 03-Jun-1946 Gender: Male Account #: [id number] Procedure:                Colonoscopy Indications:              Screening for colorectal malignant neoplasm                           No polyps last colonoscopy November 2014 Medicines:                Monitored Anesthesia Care Procedure:                Pre-Anesthesia Assessment:                           - Prior to the procedure, a History and Physical                            was performed, and patient medications and                            allergies were reviewed. The patient's tolerance of                            previous anesthesia was also reviewed. The risks                            and benefits of the procedure and the sedation                            options and risks were discussed with the patient.                            All questions were answered, and informed consent                            was obtained. Prior Anticoagulants: The patient has                            taken no anticoagulant or antiplatelet agents. ASA                            Grade Assessment: II - A patient with mild systemic                            disease. After reviewing the risks and benefits,                            the patient was deemed in satisfactory condition to                            undergo the procedure.  After obtaining informed consent, the colonoscope                            was passed under direct vision. Throughout the                            procedure, the patient's blood pressure, pulse, and                            oxygen saturations were monitored continuously. The                            Olympus Scope [id number] was introduced through the                            anus and advanced to  the the cecum, identified by                            appendiceal orifice and ileocecal valve. The                            colonoscopy was performed without difficulty. The                            patient tolerated the procedure well. The quality                            of the bowel preparation was good. The ileocecal                            valve, appendiceal orifice, and rectum were                            photographed. Scope In: 10:54:44 AM Scope Out: 11:11:28 AM Scope Withdrawal Time: 0 hours 10 minutes 56 seconds  Total Procedure Duration: 0 hours 16 minutes 44 seconds  Findings:                 The perianal and digital rectal examinations were                            normal.                           Repeat examination of right colon under NBI                            performed.                           Diverticula were found in the entire colon.                           Two sessile polyps were found in the ascending  colon. The polyps were diminutive in size. These                            polyps were removed with a cold snare. Resection                            and retrieval were complete.                           Small internal hemorrhoids were found.                           The exam was otherwise without abnormality on                            direct and retroflexion views. Complications:            No immediate complications. Estimated Blood Loss:     Estimated blood loss was minimal. Impression:               - Diverticulosis in the entire examined colon.                           - Two diminutive polyps in the ascending colon,                            removed with a cold snare. Resected and retrieved.                           - Internal hemorrhoids.                           - The examination was otherwise normal on direct                            and retroflexion views. Recommendation:           - Patient has  a contact number available for                            emergencies. The signs and symptoms of potential                            delayed complications were discussed with the                            patient. Return to normal activities tomorrow.                            Written discharge instructions were provided to the                            patient.                           - Resume previous diet.                           -  Continue present medications.                           - Await pathology results.                           - No repeat colonoscopy due to age, current                            guidelines and low risk findings today. Kary Colaizzi L. Myrtie Neither, MD 07/03/2023 11:17:15 AM This report has been signed electronically.

## 2023-07-03 NOTE — Progress Notes (Signed)
Called to room to assist during endoscopic procedure.  Patient ID and intended procedure confirmed with present staff. Received instructions for my participation in the procedure from the performing physician.  

## 2023-07-03 NOTE — Patient Instructions (Signed)
-   Resume previous diet - Continue present medications. - Await pathology results - No repeat colonoscopy due to age, current guidelines and low risk findings today.   YOU HAD AN ENDOSCOPIC PROCEDURE TODAY AT THE [facility]:   Refer to the procedure report that was given to you for any specific questions about what was found during the examination.  If the procedure report does not answer your questions, please call your gastroenterologist to clarify.  If you requested that your care partner not be given the details of your procedure findings, then the procedure report has been included in a sealed envelope for you to review at your convenience later.  YOU SHOULD EXPECT: Some feelings of bloating in the abdomen. Passage of more gas than usual.  Walking can help get rid of the air that was put into your GI tract during the procedure and reduce the bloating. If you had a lower endoscopy (such as a colonoscopy or flexible sigmoidoscopy) you may notice spotting of blood in your stool or on the toilet paper. If you underwent a bowel prep for your procedure, you may not have a normal bowel movement for a few days.  Please Note:  You might notice some irritation and congestion in your nose or some drainage.  This is from the oxygen used during your procedure.  There is no need for concern and it should clear up in a day or so.  SYMPTOMS TO REPORT IMMEDIATELY:  Following lower endoscopy (colonoscopy or flexible sigmoidoscopy):  Excessive amounts of blood in the stool  Significant tenderness or worsening of abdominal pains  Swelling of the abdomen that is new, acute  Fever of 100F or higher  For urgent or emergent issues, a gastroenterologist can be reached at any hour by calling [phone]. Do not use MyChart messaging for urgent concerns.    DIET:  We do recommend a small meal at first, but then you may proceed to your regular diet.  Drink plenty of fluids but you should avoid  alcoholic beverages for 24 hours.  ACTIVITY:  You should plan to take it easy for the rest of today and you should NOT DRIVE or use heavy machinery until tomorrow (because of the sedation medicines used during the test).    FOLLOW UP: Our staff will call the number listed on your records the next business day following your procedure.  We will call around 7:15- 8:00 am to check on you and address any questions or concerns that you may have regarding the information given to you following your procedure. If we do not reach you, we will leave a message.     If any biopsies were taken you will be contacted by phone or by letter within the next 1-3 weeks.  Please call us at [phone] if you have not heard about the biopsies in 3 weeks.    SIGNATURES/CONFIDENTIALITY: You and/or your care partner have signed paperwork which will be entered into your electronic medical record.  These signatures attest to the fact that that the information above on your After Visit Summary has been reviewed and is understood.  Full responsibility of the confidentiality of this discharge information lies with you and/or your care-partner.

## 2023-07-03 NOTE — Progress Notes (Signed)
Pt's states no medical or surgical changes since previsit or office visit. 

## 2023-07-04 ENCOUNTER — Telehealth: Payer: Self-pay

## 2023-07-04 NOTE — Telephone Encounter (Signed)
Left message on follow up call. 

## 2023-07-07 LAB — SURGICAL PATHOLOGY

## 2023-07-08 ENCOUNTER — Encounter: Payer: Self-pay | Admitting: Gastroenterology

## 2023-07-17 DIAGNOSIS — M25551 Pain in right hip: Secondary | ICD-10-CM | POA: Diagnosis not present

## 2023-07-17 DIAGNOSIS — Z96641 Presence of right artificial hip joint: Secondary | ICD-10-CM | POA: Diagnosis not present

## 2023-07-25 DIAGNOSIS — Z96641 Presence of right artificial hip joint: Secondary | ICD-10-CM | POA: Diagnosis not present

## 2023-07-28 ENCOUNTER — Other Ambulatory Visit (HOSPITAL_COMMUNITY): Payer: Self-pay | Admitting: Orthopedic Surgery

## 2023-07-28 DIAGNOSIS — Z96641 Presence of right artificial hip joint: Secondary | ICD-10-CM

## 2023-07-29 ENCOUNTER — Ambulatory Visit (HOSPITAL_COMMUNITY)
Admission: RE | Admit: 2023-07-29 | Discharge: 2023-07-29 | Disposition: A | Discharge status: Home / Self Care | Payer: Medicare Other | Source: Ambulatory Visit | Attending: Orthopedic Surgery | Admitting: Orthopedic Surgery

## 2023-07-29 ENCOUNTER — Encounter (HOSPITAL_COMMUNITY)
Admission: RE | Admit: 2023-07-29 | Discharge: 2023-07-29 | Disposition: A | Discharge status: Home / Self Care | Payer: Medicare Other | Source: Ambulatory Visit | Attending: Orthopedic Surgery | Admitting: Orthopedic Surgery

## 2023-07-29 DIAGNOSIS — M1611 Unilateral primary osteoarthritis, right hip: Secondary | ICD-10-CM | POA: Diagnosis not present

## 2023-07-29 DIAGNOSIS — Z96641 Presence of right artificial hip joint: Secondary | ICD-10-CM

## 2023-07-29 MED ORDER — TECHNETIUM TC 99M MEDRONATE IV KIT
20.0000 | PACK | Freq: Once | INTRAVENOUS | Status: AC | PRN
  Administered 2023-07-29: 20 via INTRAVENOUS

## 2023-08-20 DIAGNOSIS — M25551 Pain in right hip: Secondary | ICD-10-CM | POA: Diagnosis not present

## 2023-08-20 DIAGNOSIS — M533 Sacrococcygeal disorders, not elsewhere classified: Secondary | ICD-10-CM | POA: Diagnosis not present

## 2023-08-20 DIAGNOSIS — Z96653 Presence of artificial knee joint, bilateral: Secondary | ICD-10-CM | POA: Diagnosis not present

## 2023-08-20 DIAGNOSIS — T84030A Mechanical loosening of internal right hip prosthetic joint, initial encounter: Secondary | ICD-10-CM | POA: Diagnosis not present

## 2023-08-20 DIAGNOSIS — E7849 Other hyperlipidemia: Secondary | ICD-10-CM | POA: Diagnosis not present

## 2023-08-20 DIAGNOSIS — G8929 Other chronic pain: Secondary | ICD-10-CM | POA: Diagnosis not present

## 2023-08-20 DIAGNOSIS — Z96641 Presence of right artificial hip joint: Secondary | ICD-10-CM | POA: Diagnosis not present

## 2023-08-20 DIAGNOSIS — R936 Abnormal findings on diagnostic imaging of limbs: Secondary | ICD-10-CM | POA: Diagnosis not present

## 2023-08-20 DIAGNOSIS — I7090 Unspecified atherosclerosis: Secondary | ICD-10-CM | POA: Diagnosis not present

## 2023-08-20 DIAGNOSIS — M76891 Other specified enthesopathies of right lower limb, excluding foot: Secondary | ICD-10-CM | POA: Diagnosis not present

## 2023-08-20 DIAGNOSIS — M1711 Unilateral primary osteoarthritis, right knee: Secondary | ICD-10-CM | POA: Diagnosis not present

## 2023-08-20 DIAGNOSIS — M25561 Pain in right knee: Secondary | ICD-10-CM | POA: Diagnosis not present

## 2023-10-17 DIAGNOSIS — Z96653 Presence of artificial knee joint, bilateral: Secondary | ICD-10-CM | POA: Diagnosis not present

## 2023-10-17 DIAGNOSIS — T84030A Mechanical loosening of internal right hip prosthetic joint, initial encounter: Secondary | ICD-10-CM | POA: Diagnosis not present

## 2023-10-17 DIAGNOSIS — Z01818 Encounter for other preprocedural examination: Secondary | ICD-10-CM | POA: Diagnosis not present

## 2023-10-17 DIAGNOSIS — Z96641 Presence of right artificial hip joint: Secondary | ICD-10-CM | POA: Diagnosis not present

## 2023-10-17 DIAGNOSIS — Z6836 Body mass index (BMI) 36.0-36.9, adult: Secondary | ICD-10-CM | POA: Diagnosis not present

## 2023-10-17 DIAGNOSIS — Z96649 Presence of unspecified artificial hip joint: Secondary | ICD-10-CM | POA: Diagnosis not present

## 2023-10-24 DIAGNOSIS — I10 Essential (primary) hypertension: Secondary | ICD-10-CM | POA: Diagnosis present

## 2023-10-24 DIAGNOSIS — Z96641 Presence of right artificial hip joint: Secondary | ICD-10-CM | POA: Diagnosis not present

## 2023-10-24 DIAGNOSIS — Z96653 Presence of artificial knee joint, bilateral: Secondary | ICD-10-CM | POA: Diagnosis present

## 2023-10-24 DIAGNOSIS — R600 Localized edema: Secondary | ICD-10-CM | POA: Diagnosis not present

## 2023-10-24 DIAGNOSIS — T84030A Mechanical loosening of internal right hip prosthetic joint, initial encounter: Secondary | ICD-10-CM | POA: Diagnosis present

## 2023-10-24 DIAGNOSIS — Z96649 Presence of unspecified artificial hip joint: Secondary | ICD-10-CM | POA: Diagnosis not present

## 2023-10-24 DIAGNOSIS — T84038A Mechanical loosening of other internal prosthetic joint, initial encounter: Secondary | ICD-10-CM | POA: Diagnosis not present

## 2023-10-24 DIAGNOSIS — Z01818 Encounter for other preprocedural examination: Secondary | ICD-10-CM | POA: Diagnosis not present

## 2023-10-24 DIAGNOSIS — T8484XA Pain due to internal orthopedic prosthetic devices, implants and grafts, initial encounter: Secondary | ICD-10-CM | POA: Diagnosis not present

## 2023-10-24 DIAGNOSIS — E7849 Other hyperlipidemia: Secondary | ICD-10-CM | POA: Diagnosis present

## 2023-10-24 DIAGNOSIS — G8918 Other acute postprocedural pain: Secondary | ICD-10-CM | POA: Diagnosis not present

## 2023-10-27 DIAGNOSIS — Z96641 Presence of right artificial hip joint: Secondary | ICD-10-CM | POA: Diagnosis not present

## 2023-10-27 DIAGNOSIS — T84038A Mechanical loosening of other internal prosthetic joint, initial encounter: Secondary | ICD-10-CM | POA: Diagnosis not present

## 2023-10-27 DIAGNOSIS — T8484XA Pain due to internal orthopedic prosthetic devices, implants and grafts, initial encounter: Secondary | ICD-10-CM | POA: Diagnosis not present

## 2023-10-27 DIAGNOSIS — Z96653 Presence of artificial knee joint, bilateral: Secondary | ICD-10-CM | POA: Diagnosis present

## 2023-10-27 DIAGNOSIS — I10 Essential (primary) hypertension: Secondary | ICD-10-CM | POA: Diagnosis present

## 2023-10-27 DIAGNOSIS — T84030A Mechanical loosening of internal right hip prosthetic joint, initial encounter: Secondary | ICD-10-CM | POA: Diagnosis present

## 2023-10-27 DIAGNOSIS — R600 Localized edema: Secondary | ICD-10-CM | POA: Diagnosis not present

## 2023-10-27 DIAGNOSIS — G8918 Other acute postprocedural pain: Secondary | ICD-10-CM | POA: Diagnosis not present

## 2023-10-27 DIAGNOSIS — E7849 Other hyperlipidemia: Secondary | ICD-10-CM | POA: Diagnosis present

## 2023-10-27 DIAGNOSIS — Z96649 Presence of unspecified artificial hip joint: Secondary | ICD-10-CM | POA: Diagnosis not present

## 2023-11-07 DIAGNOSIS — Z96641 Presence of right artificial hip joint: Secondary | ICD-10-CM | POA: Diagnosis not present

## 2023-11-07 DIAGNOSIS — I10 Essential (primary) hypertension: Secondary | ICD-10-CM | POA: Diagnosis not present

## 2023-11-10 DIAGNOSIS — L821 Other seborrheic keratosis: Secondary | ICD-10-CM | POA: Diagnosis not present

## 2023-11-10 DIAGNOSIS — L812 Freckles: Secondary | ICD-10-CM | POA: Diagnosis not present

## 2023-11-10 DIAGNOSIS — Z85828 Personal history of other malignant neoplasm of skin: Secondary | ICD-10-CM | POA: Diagnosis not present

## 2023-11-10 DIAGNOSIS — L570 Actinic keratosis: Secondary | ICD-10-CM | POA: Diagnosis not present

## 2023-11-17 DIAGNOSIS — Z96641 Presence of right artificial hip joint: Secondary | ICD-10-CM | POA: Diagnosis not present

## 2023-11-17 DIAGNOSIS — Z471 Aftercare following joint replacement surgery: Secondary | ICD-10-CM | POA: Diagnosis not present

## 2024-03-26 DIAGNOSIS — H11002 Unspecified pterygium of left eye: Secondary | ICD-10-CM | POA: Diagnosis not present

## 2024-03-26 DIAGNOSIS — H2512 Age-related nuclear cataract, left eye: Secondary | ICD-10-CM | POA: Diagnosis not present

## 2024-05-19 DIAGNOSIS — H52212 Irregular astigmatism, left eye: Secondary | ICD-10-CM | POA: Diagnosis not present

## 2024-05-19 DIAGNOSIS — H11052 Peripheral pterygium, progressive, left eye: Secondary | ICD-10-CM | POA: Diagnosis not present

## 2024-05-19 DIAGNOSIS — H11002 Unspecified pterygium of left eye: Secondary | ICD-10-CM | POA: Diagnosis not present

## 2024-05-26 DIAGNOSIS — L812 Freckles: Secondary | ICD-10-CM | POA: Diagnosis not present

## 2024-05-26 DIAGNOSIS — L821 Other seborrheic keratosis: Secondary | ICD-10-CM | POA: Diagnosis not present

## 2024-05-26 DIAGNOSIS — L570 Actinic keratosis: Secondary | ICD-10-CM | POA: Diagnosis not present

## 2024-05-26 DIAGNOSIS — Z85828 Personal history of other malignant neoplasm of skin: Secondary | ICD-10-CM | POA: Diagnosis not present

## 2024-06-15 DIAGNOSIS — Z23 Encounter for immunization: Secondary | ICD-10-CM | POA: Diagnosis not present

## 2024-06-15 DIAGNOSIS — R7301 Impaired fasting glucose: Secondary | ICD-10-CM | POA: Diagnosis not present

## 2024-06-15 DIAGNOSIS — Z79899 Other long term (current) drug therapy: Secondary | ICD-10-CM | POA: Diagnosis not present

## 2024-06-15 DIAGNOSIS — E7800 Pure hypercholesterolemia, unspecified: Secondary | ICD-10-CM | POA: Diagnosis not present

## 2024-06-15 DIAGNOSIS — Z0001 Encounter for general adult medical examination with abnormal findings: Secondary | ICD-10-CM | POA: Diagnosis not present

## 2024-07-01 DIAGNOSIS — H2513 Age-related nuclear cataract, bilateral: Secondary | ICD-10-CM | POA: Diagnosis not present

## 2024-07-21 DIAGNOSIS — H25812 Combined forms of age-related cataract, left eye: Secondary | ICD-10-CM | POA: Diagnosis not present

## 2024-07-21 DIAGNOSIS — H2512 Age-related nuclear cataract, left eye: Secondary | ICD-10-CM | POA: Diagnosis not present
